# Patient Record
Sex: Female | Born: 1980 | Race: Black or African American | Hispanic: No | Marital: Married | Smoking: Never smoker
Health system: Southern US, Community
[De-identification: ages and names within clinical notes are randomized; demographics above are authoritative.]

## PROBLEM LIST (undated history)

## (undated) DIAGNOSIS — D649 Anemia, unspecified: Secondary | ICD-10-CM

## (undated) DIAGNOSIS — M6208 Separation of muscle (nontraumatic), other site: Secondary | ICD-10-CM

## (undated) DIAGNOSIS — K5909 Other constipation: Secondary | ICD-10-CM

## (undated) DIAGNOSIS — D560 Alpha thalassemia: Secondary | ICD-10-CM

## (undated) DIAGNOSIS — N200 Calculus of kidney: Secondary | ICD-10-CM

## (undated) DIAGNOSIS — N83209 Unspecified ovarian cyst, unspecified side: Secondary | ICD-10-CM

## (undated) DIAGNOSIS — Z860100 Personal history of colon polyps, unspecified: Secondary | ICD-10-CM

## (undated) DIAGNOSIS — Z8679 Personal history of other diseases of the circulatory system: Secondary | ICD-10-CM

## (undated) DIAGNOSIS — D563 Thalassemia minor: Secondary | ICD-10-CM

## (undated) HISTORY — DX: Alpha thalassemia: D56.0

## (undated) HISTORY — DX: Separation of muscle (nontraumatic), other site: M62.08

## (undated) HISTORY — DX: Personal history of colon polyps, unspecified: Z86.0100

## (undated) HISTORY — DX: Calculus of kidney: N20.0

## (undated) HISTORY — DX: Personal history of colonic polyps: Z86.010

## (undated) HISTORY — PX: HERNIA REPAIR: SHX51

## (undated) HISTORY — DX: Anemia, unspecified: D64.9

## (undated) HISTORY — PX: TUBAL LIGATION: SHX77

## (undated) HISTORY — DX: Other constipation: K59.09

## (undated) HISTORY — DX: Unspecified ovarian cyst, unspecified side: N83.209

## (undated) HISTORY — DX: Personal history of other diseases of the circulatory system: Z86.79

---
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
## 2013-04-19 HISTORY — PX: HERNIA REPAIR: SHX51

## 2014-03-19 HISTORY — PX: COLONOSCOPY: SHX174

## 2015-11-30 DIAGNOSIS — M6208 Separation of muscle (nontraumatic), other site: Secondary | ICD-10-CM | POA: Insufficient documentation

## 2015-11-30 DIAGNOSIS — K439 Ventral hernia without obstruction or gangrene: Secondary | ICD-10-CM | POA: Insufficient documentation

## 2015-11-30 HISTORY — DX: Separation of muscle (nontraumatic), other site: M62.08

## 2016-06-05 DIAGNOSIS — Z8679 Personal history of other diseases of the circulatory system: Secondary | ICD-10-CM

## 2016-06-05 DIAGNOSIS — K5909 Other constipation: Secondary | ICD-10-CM | POA: Insufficient documentation

## 2016-06-05 HISTORY — DX: Other constipation: K59.09

## 2016-06-05 HISTORY — DX: Personal history of other diseases of the circulatory system: Z86.79

## 2016-07-06 DIAGNOSIS — Z862 Personal history of diseases of the blood and blood-forming organs and certain disorders involving the immune mechanism: Secondary | ICD-10-CM | POA: Diagnosis not present

## 2016-07-06 DIAGNOSIS — Z85118 Personal history of other malignant neoplasm of bronchus and lung: Secondary | ICD-10-CM | POA: Diagnosis not present

## 2016-08-02 DIAGNOSIS — D560 Alpha thalassemia: Secondary | ICD-10-CM | POA: Diagnosis not present

## 2017-07-27 LAB — HM PAP SMEAR: HM Pap smear: NEGATIVE

## 2018-02-26 ENCOUNTER — Ambulatory Visit (INDEPENDENT_AMBULATORY_CARE_PROVIDER_SITE_OTHER): Payer: BC Managed Care – PPO | Admitting: Cardiology

## 2018-02-26 ENCOUNTER — Encounter: Payer: Self-pay | Admitting: *Deleted

## 2018-02-26 DIAGNOSIS — N200 Calculus of kidney: Secondary | ICD-10-CM | POA: Insufficient documentation

## 2018-02-26 DIAGNOSIS — R0789 Other chest pain: Secondary | ICD-10-CM | POA: Diagnosis not present

## 2018-02-26 DIAGNOSIS — D649 Anemia, unspecified: Secondary | ICD-10-CM | POA: Insufficient documentation

## 2018-02-26 NOTE — Patient Instructions (Signed)
Medication Instructions:  Your physician recommends that you continue on your current medications as directed. Please refer to the Current Medication list given to you today.  If you need a refill on your cardiac medications before your next appointment, please call your pharmacy.   Lab work: Your physician recommends that you have the following labs drawn: Please come fasting for liver and lipid panel, no appointment is needed.   If you have labs (blood work) drawn today and your tests are completely normal, you will receive your results only by: Marland Kitchen. MyChart Message (if you have MyChart) OR . A paper copy in the mail If you have any lab test that is abnormal or we need to change your treatment, we will call you to review the results.  Testing/Procedures: Your physician has requested that you have a stress echocardiogram. For further information please visit [email or web address]. Please follow instruction sheet as given.  Follow-Up: At [facility], you and your health needs are our priority.  As part of our continuing mission to provide you with exceptional heart care, we have created designated Provider Care Teams.  These Care Teams include your primary Cardiologist (physician) and Advanced Practice Providers (APPs -  Physician Assistants and Nurse Practitioners) who all work together to provide you with the care you need, when you need it.  You will need a follow up appointment in 6 months.  Please call our office 2 months in advance to schedule this appointment.  You may see another member of our [facility] Provider Team in [place]: Gypsy Balsamobert Krasowski, MD . Norman HerrlichBrian Munley, MD  Any Other Special Instructions Will Be Listed Below (If Applicable).

## 2018-02-26 NOTE — Progress Notes (Signed)
Cardiology Office Note:    Date:  02/26/2018   ID:  Judy George, DOB 1981/03/15, MRN [id number]  PCP:  Gus HeightJohnson, Andrea, PA-C  Cardiologist:  Garwin Brothersajan R Revankar, MD   Referring MD: Gus HeightJohnson, Andrea, PA-C    ASSESSMENT:    1. Chest discomfort    PLAN:    In order of problems listed above:  1. Primary prevention stressed with the patient.  Importance of compliance with diet and medication stressed and she vocalized understanding.  Her blood pressure is stable.  Her symptoms are not typical for coronary etiology.  She does not have any known significant risk factors for coronary artery disease.  To risk stratify her I will have fasting lipids drawn.  She will also undergo stress echo to understand the symptoms.  She knows to go to the nearest emergency room for any significant concerns.Patient will be seen in follow-up appointment in 6 months or earlier if the patient has any concerns.   Medication Adjustments/Labs and Tests Ordered: Current medicines are reviewed at length with the patient today.  Concerns regarding medicines are outlined above.  Orders Placed This Encounter  Procedures  . Hepatic function panel  . Lipid panel  . EKG 12-Lead  . ECHOCARDIOGRAM STRESS TEST   No orders of the defined types were placed in this encounter.    History of Present Illness:    Judy George is a 37 y.o. female who is being seen today for the evaluation of chest discomfort at the request of Gus HeightJohnson, Andrea, [place].  Patient is a pleasant 37 year old female.  She has no past medical history of essential hypertension, dyslipidemia diabetes mellitus or smoking.  Overall she is a healthy female.  She leads a sedentary lifestyle and works full-time.  She mentions to me that she has substernal chest discomfort at times and this does not occur with exertion.  She said she has had 2 aunts who passed away with coronary artery disease and therefore she is concerned about it.  When past in her  10040s and the other past in her 5060s.  Patient denies any history of syncope or dizziness.  At the time of my evaluation, the patient is alert awake oriented and in no distress.  Past Medical History:  Diagnosis Date  . Anemia   . Chronic constipation 06/05/2016  . Diastasis recti 11/30/2015  . History of irregular heartbeat 06/05/2016  . Kidney stones     Past Surgical History:  Procedure Laterality Date  . CESAREAN SECTION     X3  . HERNIA REPAIR    . TUBAL LIGATION      Current Medications: Current Meds  Medication Sig  . linaclotide (LINZESS) 290 MCG CAPS capsule Take 290 mcg by mouth at bedtime as needed.     Allergies:   Patient has no known allergies.   Social History   Socioeconomic History  . Marital status: Married    Spouse name: Not on file  . Number of children: Not on file  . Years of education: Not on file  . Highest education level: Not on file  Occupational History  . Not on file  Social Needs  . Financial resource strain: Not on file  . Food insecurity:    Worry: Not on file    Inability: Not on file  . Transportation needs:    Medical: Not on file    Non-medical: Not on file  Tobacco Use  . Smoking status: Never Smoker  . Smokeless tobacco:  Never Used  Substance and Sexual Activity  . Alcohol use: Never    Frequency: Never  . Drug use: Not on file  . Sexual activity: Not on file  Lifestyle  . Physical activity:    Days per week: Not on file    Minutes per session: Not on file  . Stress: Not on file  Relationships  . Social connections:    Talks on phone: Not on file    Gets together: Not on file    Attends religious service: Not on file    Active member of club or organization: Not on file    Attends meetings of clubs or organizations: Not on file    Relationship status: Not on file  Other Topics Concern  . Not on file  Social History Narrative  . Not on file     Family History: The patient's family history includes Diabetes in her  maternal aunt; Heart attack in her maternal aunt; Hypertension in her mother.  ROS:   Please see the history of present illness.    All other systems reviewed and are negative.  EKGs/Labs/Other Studies Reviewed:    The following studies were reviewed today: EKG reveals sinus rhythm and nonspecific ST-T changes.   Recent Labs: No results found for requested labs within last 8760 hours.  Recent Lipid Panel No results found for: CHOL, TRIG, HDL, CHOLHDL, VLDL, LDLCALC, LDLDIRECT  Physical Exam:    VS:  BP 110/74 (BP Location: Right Arm, Patient Position: Sitting, Cuff Size: Normal)   Pulse 61   Ht 5\' 2"  (1.575 m)   Wt 180 lb (81.6 kg)   BMI 32.92 kg/m     Wt Readings from Last 3 Encounters:  02/26/18 180 lb (81.6 kg)     GEN: Patient is in no acute distress HEENT: Normal NECK: No JVD; No carotid bruits LYMPHATICS: No lymphadenopathy CARDIAC: S1 S2 regular, 2/6 systolic murmur at the apex. RESPIRATORY:  Clear to auscultation without rales, wheezing or rhonchi  ABDOMEN: Soft, non-tender, non-distended MUSCULOSKELETAL:  No edema; No deformity  SKIN: Warm and dry NEUROLOGIC:  Alert and oriented x 3 PSYCHIATRIC:  Normal affect    Signed, Garwin Brothers, MD  02/26/2018 4:15 PM    [facility]

## 2018-03-05 ENCOUNTER — Other Ambulatory Visit: Payer: Self-pay

## 2018-03-13 ENCOUNTER — Telehealth: Payer: Self-pay | Admitting: *Deleted

## 2018-03-13 ENCOUNTER — Encounter: Payer: Self-pay | Admitting: *Deleted

## 2018-03-13 DIAGNOSIS — R0789 Other chest pain: Secondary | ICD-10-CM

## 2018-03-13 NOTE — Telephone Encounter (Signed)
-----   Message from Garwin Brothersajan R Revankar, MD sent at 03/10/2018  8:47 AM EST ----- Regarding: RE: Denied Stress Echo Ett and echo ----- Message ----- From: Craige CottaAnderson, Ashley S, RN Sent: 03/07/2018   4:39 PM EST To: Garwin Brothersajan R Revankar, MD, Carren RangNicholas Gipson, CMA Subject: FW: Denied Stress Echo                          ----- Message ----- From: Minette BrineWelch, Lisa B Sent: 03/06/2018   3:05 PM EST To: Craige CottaAshley S Anderson, RN Subject: FW: Denied Stress Echo                          ----- Message ----- From: Britt BologneseHall, Charmaine M Sent: 03/06/2018   2:48 PM EST To: Ludwig ClarksAdrienne C Troutman, Rajan R Revankar, MD Subject: Denied Stress Echo                             Dr. Tomie Chinaevankar  Pt's insurance denied stress echo.  Do you want to change to GXT?  (Pt can walk on treadmill and CRF strong fam hx CAD)  Thanks  Charmaine

## 2018-03-13 NOTE — Telephone Encounter (Signed)
Patient informed that insurance denied approval for a stress echocardiogram. Patient informed that Dr. Tomie Chinaevankar recommends scheduling an echocardiogram and exercise tolerance test. Patient agreeable. Patient has been scheduled for an ETT and echocardiogram on 03/20/2018 at 11 am and [facility] [address] [facility]. Reviewed instructions and sent a copy in the mail. Patient verbalized understanding. No further questions.

## 2018-03-20 ENCOUNTER — Ambulatory Visit (INDEPENDENT_AMBULATORY_CARE_PROVIDER_SITE_OTHER): Payer: BC Managed Care – PPO

## 2018-03-20 ENCOUNTER — Ambulatory Visit (HOSPITAL_COMMUNITY): Payer: BC Managed Care – PPO | Attending: Cardiovascular Disease

## 2018-03-20 DIAGNOSIS — R0789 Other chest pain: Secondary | ICD-10-CM

## 2018-03-20 LAB — EXERCISE TOLERANCE TEST
CSEPED: 7 min
Estimated workload: 9.9 METS
Exercise duration (sec): 54 s
MPHR: 181 {beats}/min
Peak HR: 179 {beats}/min
Percent HR: 98 %
RPE: 16
Rest HR: 89 {beats}/min

## 2018-03-25 ENCOUNTER — Telehealth: Payer: Self-pay

## 2018-03-25 NOTE — Telephone Encounter (Signed)
-----   Message from Garwin Brothers, MD sent at 03/25/2018  9:29 AM EST ----- The results of the study is unremarkable. Please inform patient. I will discuss in detail at next appointment. Cc  primary care/referring physician Garwin Brothers, MD 03/25/2018 9:29 AM

## 2018-03-25 NOTE — Telephone Encounter (Signed)
-----   Message from Garwin Brothers, MD sent at 03/25/2018  9:13 AM EST ----- The results of the study is unremarkable. Please inform patient. I will discuss in detail at next appointment. Cc  primary care/referring physician Garwin Brothers, MD 03/25/2018 9:13 AM

## 2018-03-25 NOTE — Telephone Encounter (Signed)
Called patient and left detailed voice message on patients phone regarding test results. 

## 2018-03-25 NOTE — Telephone Encounter (Signed)
Called patient and left detailed voice message on phone regarding test results. 

## 2018-11-07 ENCOUNTER — Other Ambulatory Visit: Payer: Self-pay | Admitting: Physician Assistant

## 2018-11-07 DIAGNOSIS — Z1231 Encounter for screening mammogram for malignant neoplasm of breast: Secondary | ICD-10-CM

## 2018-11-21 ENCOUNTER — Encounter: Payer: Self-pay | Admitting: Gastroenterology

## 2018-12-17 ENCOUNTER — Ambulatory Visit: Payer: BC Managed Care – PPO | Admitting: Gastroenterology

## 2018-12-17 ENCOUNTER — Other Ambulatory Visit: Payer: Self-pay

## 2018-12-17 ENCOUNTER — Encounter: Payer: Self-pay | Admitting: Gastroenterology

## 2018-12-17 VITALS — BP 116/82 | HR 82 | Temp 98.0°F | Ht 62.0 in | Wt 185.5 lb

## 2018-12-17 DIAGNOSIS — D509 Iron deficiency anemia, unspecified: Secondary | ICD-10-CM

## 2018-12-17 DIAGNOSIS — K581 Irritable bowel syndrome with constipation: Secondary | ICD-10-CM

## 2018-12-17 DIAGNOSIS — Z8371 Family history of colonic polyps: Secondary | ICD-10-CM | POA: Diagnosis not present

## 2018-12-17 MED ORDER — TRULANCE 3 MG PO TABS: 3.0000 mg | ORAL_TABLET | Freq: Every day | ORAL | 11 refills | Status: DC

## 2018-12-17 MED ORDER — SUPREP BOWEL PREP KIT 17.5-3.13-1.6 GM/177ML PO SOLN: 1.0000 | ORAL | 0 refills | Status: DC

## 2018-12-17 MED ORDER — OMEPRAZOLE 20 MG PO CPDR: 20.0000 mg | DELAYED_RELEASE_CAPSULE | Freq: Every day | ORAL | 11 refills | Status: DC

## 2018-12-17 NOTE — Patient Instructions (Signed)
If you are age 40 or older, your body mass index should be between 23-30. Your Body mass index is 33.93 kg/m. If this is out of the aforementioned range listed, please consider follow up with your Primary Care Provider.  If you are age 23 or younger, your body mass index should be between 19-25. Your Body mass index is 33.93 kg/m. If this is out of the aformentioned range listed, please consider follow up with your Primary Care Provider.   You have been scheduled for an endoscopy and colonoscopy. Please follow the written instructions given to you at your visit today. Please pick up your prep supplies at the pharmacy within the next 1-3 days. If you use inhalers (even only as needed), please bring them with you on the day of your procedure. Your physician has requested that you go to www.startemmi.com and enter the access code given to you at your visit today. This web site gives a general overview about your procedure. However, you should still follow specific instructions given to you by our office regarding your preparation for the procedure.  We have sent the following medications to your pharmacy for you to pick up at your convenience: Trulance Suprep  Increase water intake.   Thank you,  Dr. Jackquline Denmark

## 2018-12-17 NOTE — Progress Notes (Signed)
Chief Complaint: Constipation/lower abdominal pain.  Referring Provider:  Gus Height, PA-C      ASSESSMENT AND PLAN;   #1. IBS with constipation. Nl TSH 05/2016 #2. H/O colonic polyps 2015/2016 (Dr Judy George).  Due for repeat colonoscopy. #3. 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 GERD with NCCP.  Neg cardiac work-up. #6. FH colon polyps (mom < 50)   Plan: - Omeprazole 20mg  po qAM, 30, 3 refills. - Trulance 3mg  po qd. have given cards and coupons.  We do not have any samples.  If expensive, she will continue taking Linzess 290 as before. - Increase water intake - Proceed with EGD/colon. Discussed risks & benefits. (Risks including rare perforation req laparotomy, bleeding after biopsies/polypectomy req blood transfusion, rare chance of missing neoplasms, risks of anesthesia/sedation). Benefits outweigh the risks. Patient agrees to proceed. All the questions were answered. Consent forms given for review. -If still with Abdo pain, consider CT Abdo/pelvis possibly followed by anorectal manometry/Sitzmarks study.    HPI:    Judy George is a 38 y.o. female  Judy George, mom is a patient of ours Longstanding history of the patient ever since first pregnancy with daughter 15 years ago.  She does pass pellet-like stools, associated lower abdominal cramps which gets better with defecation, abdominal bloating.  If she does not take any laxatives, she would have 1 bowel movement in 1 to 2 weeks.  She does try to drink plenty of water.  Has tried multiple medications including MiraLAX which did not work very well.  Linzess 290 works off and on.  Has tried herbal tea as well.  Also diagnosed with iron deficiency anemia.  Rare rectal bleeding especially if she gets severely constipated.  Does complain of heartburn.  No significant odynophagia or dysphagia.  Had noncardiac chest pains requiring 2D echo and cardiac stress test in January which was negative.  Advised GI work-up.  Denies  nonsteroidal use.  Denies intake of calcium  No weight loss or loss of appetite.   Had colonoscopy performed by Dr. Chartered loss George ?  2015/2016-had one colonic polyp removed.  Mom also had polyps. Past Medical History:  Diagnosis Date  . Anemia   . Chronic constipation 06/05/2016  . Diastasis recti 11/30/2015  . History of colon polyps   . History of irregular heartbeat 06/05/2016  . Kidney stones     Past Surgical History:  Procedure Laterality Date  . CESAREAN SECTION     X3  . COLONOSCOPY  2016   Dr 06/07/2016 in [place] 1 removed and it was non cancerous   . HERNIA REPAIR  04/2013  . TUBAL LIGATION      Family History  Problem Relation Age of Onset  . Hypertension Mother   . Colon polyps Mother   . Diabetes Maternal Aunt   . Heart attack Maternal Aunt   . Colon cancer Neg Hx   . Esophageal cancer Neg Hx     Social History   Tobacco Use  . Smoking status: Never Smoker  . Smokeless tobacco: Never Used  Substance Use Topics  . Alcohol use: Never    Frequency: Never  . Drug use: Never    Current Outpatient Medications  Medication Sig Dispense Refill  . Docusate Sodium (COLACE PO) Take 3 tablets by mouth as needed.    . linaclotide (LINZESS) 290 MCG CAPS capsule Take 290 mcg by mouth daily.     . polyethylene glycol (MIRALAX / GLYCOLAX) 17 g packet Take 17  g by mouth as needed.     No current facility-administered medications for this visit.     No Known Allergies  Review of Systems:  Constitutional: Denies fever, chills, diaphoresis, appetite change and fatigue.  HEENT: Denies photophobia, eye pain, redness, hearing loss, ear pain, congestion, sore throat, rhinorrhea, sneezing, mouth sores, neck pain, neck stiffness and tinnitus.   Respiratory: Denies SOB, DOE, cough, chest tightness,  and wheezing.   Cardiovascular: Denies chest pain, palpitations and leg swelling.  Genitourinary: Denies dysuria, urgency, frequency, hematuria, flank pain and difficulty urinating.   Musculoskeletal: Denies myalgias, back pain, joint swelling, arthralgias and gait problem.  Skin: No rash.  Neurological: Denies dizziness, seizures, syncope, weakness, light-headedness, numbness and headaches.  Hematological: Denies adenopathy. Easy bruising, personal or family bleeding history  Psychiatric/Behavioral: No anxiety or depression     Physical Exam:    BP 116/82   Pulse 82   Temp 98 F (36.7 C)   Ht 5\' 2"  (1.575 m)   Wt 185 lb 8 oz (84.1 kg)   BMI 33.93 kg/m  Filed Weights   12/17/18 0915  Weight: 185 lb 8 oz (84.1 kg)   Constitutional:  Well-developed, in no acute distress. Psychiatric: Normal mood and affect. Behavior is normal. HEENT: Pupils normal.  Conjunctivae are normal. No scleral icterus. Neck supple.  Cardiovascular: Normal rate, regular rhythm. No edema Pulmonary/chest: Effort normal and breath sounds normal. No wheezing, rales or rhonchi. Abdominal: Soft, nondistended. Nontender. Bowel sounds active throughout. There are no masses palpable. No hepatomegaly.  Rectal diastases. Rectal:  defered Neurological: Alert and oriented to person place and time. Skin: Skin is warm and dry. No rashes noted.  Data Reviewed: I have personally reviewed following labs and imaging studies Labs reviewed from 05/2017 hemoglobin 11.8, MCV 72.  Labs 05/2016 hemoglobin 11.2, MCV 70.  Normal CMP otherwise.  Normal TSH 05/2016   Judy Austria, MD 12/17/2018, 9:23 AM  Cc: Judy Jump, PA-C

## 2018-12-22 ENCOUNTER — Other Ambulatory Visit: Payer: Self-pay

## 2018-12-22 ENCOUNTER — Ambulatory Visit
Admission: RE | Admit: 2018-12-22 | Discharge: 2018-12-22 | Disposition: A | Discharge status: Home / Self Care | Payer: BC Managed Care – PPO | Source: Ambulatory Visit | Attending: Medical | Admitting: Medical

## 2018-12-22 DIAGNOSIS — Z1231 Encounter for screening mammogram for malignant neoplasm of breast: Secondary | ICD-10-CM

## 2018-12-23 LAB — HM MAMMOGRAPHY

## 2019-01-09 ENCOUNTER — Encounter: Payer: Self-pay | Admitting: Gastroenterology

## 2019-01-23 ENCOUNTER — Other Ambulatory Visit: Payer: Self-pay

## 2019-01-23 ENCOUNTER — Encounter: Payer: Self-pay | Admitting: Gastroenterology

## 2019-01-23 ENCOUNTER — Ambulatory Visit (AMBULATORY_SURGERY_CENTER): Payer: BC Managed Care – PPO | Admitting: Gastroenterology

## 2019-01-23 VITALS — BP 124/70 | HR 72 | Temp 98.3°F | Resp 15 | Ht 62.0 in | Wt 185.0 lb

## 2019-01-23 DIAGNOSIS — D508 Other iron deficiency anemias: Secondary | ICD-10-CM

## 2019-01-23 DIAGNOSIS — K2950 Unspecified chronic gastritis without bleeding: Secondary | ICD-10-CM | POA: Diagnosis present

## 2019-01-23 DIAGNOSIS — K581 Irritable bowel syndrome with constipation: Secondary | ICD-10-CM

## 2019-01-23 DIAGNOSIS — K648 Other hemorrhoids: Secondary | ICD-10-CM | POA: Diagnosis not present

## 2019-01-23 LAB — HM COLONOSCOPY

## 2019-01-23 MED ORDER — SODIUM CHLORIDE 0.9 % IV SOLN: 500.0000 mL | Freq: Once | INTRAVENOUS | Status: DC

## 2019-01-23 NOTE — Progress Notes (Signed)
Called to room to assist during endoscopic procedure.  Patient ID and intended procedure confirmed with present staff. Received instructions for my participation in the procedure from the performing physician.  

## 2019-01-23 NOTE — Progress Notes (Signed)
Report to PACU, RN, vss, BBS= Clear.  

## 2019-01-23 NOTE — Patient Instructions (Signed)
YOU HAD AN ENDOSCOPIC PROCEDURE TODAY AT THE [facility]:   Refer to the procedure report that was given to you for any specific questions about what was found during the examination.  If the procedure report does not answer your questions, please call your gastroenterologist to clarify.  If you requested that your care partner not be given the details of your procedure findings, then the procedure report has been included in a sealed envelope for you to review at your convenience later.  YOU SHOULD EXPECT: Some feelings of bloating in the abdomen. Passage of more gas than usual.  Walking can help get rid of the air that was put into your GI tract during the procedure and reduce the bloating. If you had a lower endoscopy (such as a colonoscopy or flexible sigmoidoscopy) you may notice spotting of blood in your stool or on the toilet paper. If you underwent a bowel prep for your procedure, you may not have a normal bowel movement for a few days.  Please Note:  You might notice some irritation and congestion in your nose or some drainage.  This is from the oxygen used during your procedure.  There is no need for concern and it should clear up in a day or so.  SYMPTOMS TO REPORT IMMEDIATELY:   Following lower endoscopy (colonoscopy or flexible sigmoidoscopy):  Excessive amounts of blood in the stool  Significant tenderness or worsening of abdominal pains  Swelling of the abdomen that is new, acute  Fever of 100F or higher   Following upper endoscopy (EGD)  Vomiting of blood or coffee ground material  New chest pain or pain under the shoulder blades  Painful or persistently difficult swallowing  New shortness of breath  Fever of 100F or higher  Black, tarry-looking stools  For urgent or emergent issues, a gastroenterologist can be reached at any hour by calling [phone].   DIET:  We do recommend a small meal at first, but then you may proceed to your regular diet.  Drink  plenty of fluids but you should avoid alcoholic beverages for 24 hours.  ACTIVITY:  You should plan to take it easy for the rest of today and you should NOT DRIVE or use heavy machinery until tomorrow (because of the sedation medicines used during the test).    FOLLOW UP: Our staff will call the number listed on your records 48-72 hours following your procedure to check on you and address any questions or concerns that you may have regarding the information given to you following your procedure. If we do not reach you, we will leave a message.  We will attempt to reach you two times.  During this call, we will ask if you have developed any symptoms of COVID 19. If you develop any symptoms (ie: fever, flu-like symptoms, shortness of breath, cough etc.) before then, please call [phone].  If you test positive for Covid 19 in the 2 weeks post procedure, please call and report this information to us.    If any biopsies were taken you will be contacted by phone or by letter within the next 1-3 weeks.  Please call us at [phone] if you have not heard about the biopsies in 3 weeks.    SIGNATURES/CONFIDENTIALITY: You and/or your care partner have signed paperwork which will be entered into your electronic medical record.  These signatures attest to the fact that that the information above on your After Visit Summary has been reviewed and is   understood.  Full responsibility of the confidentiality of this discharge information lies with you and/or your care-partner. 

## 2019-01-23 NOTE — Op Note (Signed)
[facility] Patient Name: Judy George Procedure Date: 01/23/2019 10:20 AM MRN: [id number] Endoscopist: Jackquline Denmark , MD Age: 38 Referring MD:  Date of Birth: [birth year] Gender: Female Account #: [id number] Procedure:                Upper GI endoscopy Indications:              Iron deficiency anemia, Heartburn Medicines:                Monitored Anesthesia Care Procedure:                Pre-Anesthesia Assessment:                           - Prior to the procedure, a History and Physical                            was performed, and patient medications and                            allergies were reviewed. The patient's tolerance of                            previous anesthesia was also reviewed. The risks                            and benefits of the procedure and the sedation                            options and risks were discussed with the patient.                            All questions were answered, and informed consent                            was obtained. Prior Anticoagulants: The patient has                            taken no previous anticoagulant or antiplatelet                            agents. ASA Grade Assessment: II - A patient with                            mild systemic disease. After reviewing the risks                            and benefits, the patient was deemed in                            satisfactory condition to undergo the procedure.                           After obtaining informed consent, the endoscope was  passed under direct vision. Throughout the                            procedure, the patient's blood pressure, pulse, and                            oxygen saturations were monitored continuously. The                            Endoscope was introduced through the mouth, and                            advanced to the second part of duodenum. The upper                            GI endoscopy was  accomplished without difficulty.                            The patient tolerated the procedure well. Scope In: Scope Out: Findings:                 The examined esophagus was normal.                           The Z-line was regular and was found 35 cm from the                            incisors.                           Localized minimal inflammation characterized by                            erythema was found in the gastric antrum. Biopsies                            were taken with a cold forceps for histology.                           The examined duodenum was normal. Biopsies for                            histology were taken with a cold forceps for                            evaluation of celiac disease. Complications:            No immediate complications. Estimated Blood Loss:     Estimated blood loss: none. Impression:               -Minimal gastritis                           -Otherwise normal EGD. Recommendation:           - Patient has a contact number available for  emergencies. The signs and symptoms of potential                            delayed complications were discussed with the                            patient. Return to normal activities tomorrow.                            Written discharge instructions were provided to the                            patient.                           - Resume previous diet.                           - Continue present medications.                           - Await pathology results.                           - No ibuprofen, naproxen, or other non-steroidal                            anti-inflammatory drugs.                           - Return to GI clinic in 12 weeks. Judy Bolognaajesh [name], MD 01/23/2019 10:58:02 AM This report has been signed electronically.

## 2019-01-23 NOTE — Op Note (Signed)
[facility] Patient Name: Judy George Procedure Date: 01/23/2019 10:20 AM MRN: [id number] Endoscopist: Lynann Bologna , MD Age: 38 Referring MD:  Date of Birth: Mar 12, 1981 Gender: Female Account #: [id number] Procedure:                Colonoscopy Indications:              Colon cancer screening in patient at increased                            risk: Family history of 1st-degree relative with                            colon polyps before age 59 years. Iron deficiency                            anemia. ? History of polyps. Medicines:                Monitored Anesthesia Care Procedure:                Pre-Anesthesia Assessment:                           - Prior to the procedure, a History and Physical                            was performed, and patient medications and                            allergies were reviewed. The patient's tolerance of                            previous anesthesia was also reviewed. The risks                            and benefits of the procedure and the sedation                            options and risks were discussed with the patient.                            All questions were answered, and informed consent                            was obtained. Prior Anticoagulants: The patient has                            taken no previous anticoagulant or antiplatelet                            agents. ASA Grade Assessment: II - A patient with                            mild systemic disease. After reviewing the risks  and benefits, the patient was deemed in                            satisfactory condition to undergo the procedure.                           After obtaining informed consent, the colonoscope                            was passed under direct vision. Throughout the                            procedure, the patient's blood pressure, pulse, and                            oxygen saturations were monitored  continuously. The                            Colonoscope was introduced through the anus and                            advanced to the 2 cm into the ileum. The                            colonoscopy was performed without difficulty. The                            patient tolerated the procedure well. The quality                            of the bowel preparation was good. The terminal                            ileum, ileocecal valve, appendiceal orifice, and                            rectum were photographed. Scope In: 10:39:44 AM Scope Out: 10:53:02 AM Scope Withdrawal Time: 0 hours 9 minutes 0 seconds  Total Procedure Duration: 0 hours 13 minutes 18 seconds  Findings:                 The colon (entire examined portion) appeared                            normal. The colon was highly redundant.                           Non-bleeding internal hemorrhoids were found during                            retroflexion. The hemorrhoids were small.                           The terminal ileum appeared normal.  The exam was otherwise without abnormality on                            direct and retroflexion views. Complications:            No immediate complications. Estimated Blood Loss:     Estimated blood loss: none. Impression:               -Small internal hemorrhoids.                           -Otherwise normal colonoscopy to TI. The colon was                            highly redundant. Recommendation:           - Patient has a contact number available for                            emergencies. The signs and symptoms of potential                            delayed complications were discussed with the                            patient. Return to normal activities tomorrow.                            Written discharge instructions were provided to the                            patient.                           - Resume previous diet.                            - Continue present medications.                           - Repeat colonoscopy in 10 years for screening                            purposes. Earlier, if with any new problems or if                            there is any change in family history.                           - Return to GI clinic in 8 weeks. Jackquline Denmark, MD 01/23/2019 11:03:02 AM This report has been signed electronically.

## 2019-01-27 ENCOUNTER — Telehealth: Payer: Self-pay

## 2019-01-27 ENCOUNTER — Telehealth: Payer: Self-pay | Admitting: *Deleted

## 2019-01-27 NOTE — Telephone Encounter (Signed)
First follow up call attempt.  Left message on voicemail to call with any questions or concerns.

## 2019-01-27 NOTE — Telephone Encounter (Signed)
2nd follow up call made.  NALM 

## 2019-01-30 ENCOUNTER — Telehealth: Payer: Self-pay | Admitting: Gastroenterology

## 2019-01-30 NOTE — Telephone Encounter (Signed)
Please review and advise.

## 2019-01-30 NOTE — Telephone Encounter (Signed)
The colonoscopy was negative (see report) EGD biopsies-negative for celiac disease, negative for H. Pylori. She should be getting a letter fairly soon.  Thx  RG

## 2019-01-30 NOTE — Telephone Encounter (Signed)
Left message for patient to call back to the office;  

## 2019-01-30 NOTE — Telephone Encounter (Signed)
Pt inquired about results of colonoscopy.  

## 2019-01-30 NOTE — Telephone Encounter (Signed)
Pt returned your call.  

## 2019-01-30 NOTE — Telephone Encounter (Signed)
Called and spoke with patient-patient informed of result note and MD recommendations; patient is agreeable with plan of care; Patient verbalized understanding of information/instructions;  Patient was advised to call the office at [phone] if questions/concerns arise; 

## 2019-02-01 ENCOUNTER — Encounter: Payer: Self-pay | Admitting: Gastroenterology

## 2019-03-24 ENCOUNTER — Ambulatory Visit: Payer: BC Managed Care – PPO | Admitting: Cardiology

## 2019-03-24 ENCOUNTER — Telehealth: Payer: Self-pay

## 2019-03-24 NOTE — Telephone Encounter (Signed)
Left message for patient to call office to see if she was able to be seen earlier today.

## 2019-06-04 ENCOUNTER — Encounter: Payer: Self-pay | Admitting: Nurse Practitioner

## 2019-06-04 ENCOUNTER — Other Ambulatory Visit: Payer: Self-pay | Admitting: Family Medicine

## 2019-06-04 ENCOUNTER — Other Ambulatory Visit: Payer: Self-pay

## 2019-06-04 ENCOUNTER — Ambulatory Visit: Payer: BC Managed Care – PPO | Admitting: Nurse Practitioner

## 2019-06-04 VITALS — BP 108/72 | HR 96 | Temp 97.9°F | Ht 62.0 in | Wt 163.0 lb

## 2019-06-04 DIAGNOSIS — E6609 Other obesity due to excess calories: Secondary | ICD-10-CM | POA: Diagnosis not present

## 2019-06-04 DIAGNOSIS — K5909 Other constipation: Secondary | ICD-10-CM | POA: Diagnosis not present

## 2019-06-04 DIAGNOSIS — Z6831 Body mass index (BMI) 31.0-31.9, adult: Secondary | ICD-10-CM | POA: Insufficient documentation

## 2019-06-04 DIAGNOSIS — Z6830 Body mass index (BMI) 30.0-30.9, adult: Secondary | ICD-10-CM | POA: Diagnosis not present

## 2019-06-04 MED ORDER — PHENTERMINE HCL 37.5 MG PO TABS: 37.5000 mg | ORAL_TABLET | Freq: Every day | ORAL | 0 refills | Status: DC

## 2019-06-04 NOTE — Patient Instructions (Addendum)
Class 1 obesity due to excess calories without serious comorbidity with body mass index (BMI) of 30.0 to 30.9 in adult Well controlled.  No changes to medication dose, refill sent to pharmacy Continue to work on eating a healthy diet and exercise.  No Labs drawn today.   Chronic constipation Well controlled.  No changes to medication dose. Continue to work on eating a healthy diet and exercise.  No Labs drawn today.    Calorie Counting for Weight Loss Calories are units of energy. Your body needs a certain amount of calories from food to keep you going throughout the day. When you eat more calories than your body needs, your body stores the extra calories as fat. When you eat fewer calories than your body needs, your body burns fat to get the energy it needs. Calorie counting means keeping track of how many calories you eat and drink each day. Calorie counting can be helpful if you need to lose weight. If you make sure to eat fewer calories than your body needs, you should lose weight. Ask your health care provider what a healthy weight is for you. For calorie counting to work, you will need to eat the right number of calories in a day in order to lose a healthy amount of weight per week. A dietitian can help you determine how many calories you need in a day and will give you suggestions on how to reach your calorie goal.  A healthy amount of weight to lose per week is usually 1-2 lb (0.5-0.9 kg). This usually means that your daily calorie intake should be reduced by 500-750 calories.  Eating 1,200 - 1,500 calories per day can help most women lose weight.  Eating 1,500 - 1,800 calories per day can help most men lose weight. What is my plan? My goal is to have __________ calories per day. If I have this many calories per day, I should lose around __________ pounds per week. What do I need to know about calorie counting? In order to meet your daily calorie goal, you will need to:  Find  out how many calories are in each food you would like to eat. Try to do this before you eat.  Decide how much of the food you plan to eat.  Write down what you ate and how many calories it had. Doing this is called keeping a food log. To successfully lose weight, it is important to balance calorie counting with a healthy lifestyle that includes regular activity. Aim for 150 minutes of moderate exercise (such as walking) or 75 minutes of vigorous exercise (such as running) each week. Where do I find calorie information?  The number of calories in a food can be found on a Nutrition Facts label. If a food does not have a Nutrition Facts label, try to look up the calories online or ask your dietitian for help. Remember that calories are listed per serving. If you choose to have more than one serving of a food, you will have to multiply the calories per serving by the amount of servings you plan to eat. For example, the label on a package of bread might say that a serving size is 1 slice and that there are 90 calories in a serving. If you eat 1 slice, you will have eaten 90 calories. If you eat 2 slices, you will have eaten 180 calories. How do I keep a food log? Immediately after each meal, record the following information in your  food log:  What you ate. Don't forget to include toppings, sauces, and other extras on the food.  How much you ate. This can be measured in cups, ounces, or number of items.  How many calories each food and drink had.  The total number of calories in the meal. Keep your food log near you, such as in a small notebook in your pocket, or use a mobile app or website. Some programs will calculate calories for you and show you how many calories you have left for the day to meet your goal. What are some calorie counting tips?   Use your calories on foods and drinks that will fill you up and not leave you hungry: ? Some examples of foods that fill you up are nuts and nut  butters, vegetables, lean proteins, and high-fiber foods like whole grains. High-fiber foods are foods with more than 5 g fiber per serving. ? Drinks such as sodas, specialty coffee drinks, alcohol, and juices have a lot of calories, yet do not fill you up.  Eat nutritious foods and avoid empty calories. Empty calories are calories you get from foods or beverages that do not have many vitamins or protein, such as candy, sweets, and soda. It is better to have a nutritious high-calorie food (such as an avocado) than a food with few nutrients (such as a bag of chips).  Know how many calories are in the foods you eat most often. This will help you calculate calorie counts faster.  Pay attention to calories in drinks. Low-calorie drinks include water and unsweetened drinks.  Pay attention to nutrition labels for "low fat" or "fat free" foods. These foods sometimes have the same amount of calories or more calories than the full fat versions. They also often have added sugar, starch, or salt, to make up for flavor that was removed with the fat.  Find a way of tracking calories that works for you. Get creative. Try different apps or programs if writing down calories does not work for you. What are some portion control tips?  Know how many calories are in a serving. This will help you know how many servings of a certain food you can have.  Use a measuring cup to measure serving sizes. You could also try weighing out portions on a kitchen scale. With time, you will be able to estimate serving sizes for some foods.  Take some time to put servings of different foods on your favorite plates, bowls, and cups so you know what a serving looks like.  Try not to eat straight from a bag or box. Doing this can lead to overeating. Put the amount you would like to eat in a cup or on a plate to make sure you are eating the right portion.  Use smaller plates, glasses, and bowls to prevent overeating.  Try not to  multitask (for example, watch TV or use your computer) while eating. If it is time to eat, sit down at a table and enjoy your food. This will help you to know when you are full. It will also help you to be aware of what you are eating and how much you are eating. What are tips for following this plan? Reading food labels  Check the calorie count compared to the serving size. The serving size may be smaller than what you are used to eating.  Check the source of the calories. Make sure the food you are eating is high in vitamins and  protein and low in saturated and trans fats. Shopping  Read nutrition labels while you shop. This will help you make healthy decisions before you decide to purchase your food.  Make a grocery list and stick to it. Cooking  Try to cook your favorite foods in a healthier way. For example, try baking instead of frying.  Use low-fat dairy products. Meal planning  Use more fruits and vegetables. Half of your plate should be fruits and vegetables.  Include lean proteins like poultry and fish. How do I count calories when eating out?  Ask for smaller portion sizes.  Consider sharing an entree and sides instead of getting your own entree.  If you get your own entree, eat only half. Ask for a box at the beginning of your meal and put the rest of your entree in it so you are not tempted to eat it.  If calories are listed on the menu, choose the lower calorie options.  Choose dishes that include vegetables, fruits, whole grains, low-fat dairy products, and lean protein.  Choose items that are boiled, broiled, grilled, or steamed. Stay away from items that are buttered, battered, fried, or served with cream sauce. Items labeled "crispy" are usually fried, unless stated otherwise.  Choose water, low-fat milk, unsweetened iced tea, or other drinks without added sugar. If you want an alcoholic beverage, choose a lower calorie option such as a glass of wine or light  beer.  Ask for dressings, sauces, and syrups on the side. These are usually high in calories, so you should limit the amount you eat.  If you want a salad, choose a garden salad and ask for grilled meats. Avoid extra toppings like bacon, cheese, or fried items. Ask for the dressing on the side, or ask for olive oil and vinegar or lemon to use as dressing.  Estimate how many servings of a food you are given. For example, a serving of cooked rice is  cup or about the size of half a baseball. Knowing serving sizes will help you be aware of how much food you are eating at restaurants. The list below tells you how big or small some common portion sizes are based on everyday objects: ? 1 oz--4 stacked dice. ? 3 oz--1 deck of cards. ? 1 tsp--1 die. ? 1 Tbsp-- a ping-pong ball. ? 2 Tbsp--1 ping-pong ball. ?  cup-- baseball. ? 1 cup--1 baseball. Summary  Calorie counting means keeping track of how many calories you eat and drink each day. If you eat fewer calories than your body needs, you should lose weight.  A healthy amount of weight to lose per week is usually 1-2 lb (0.5-0.9 kg). This usually means reducing your daily calorie intake by 500-750 calories.  The number of calories in a food can be found on a Nutrition Facts label. If a food does not have a Nutrition Facts label, try to look up the calories online or ask your dietitian for help.  Use your calories on foods and drinks that will fill you up, and not on foods and drinks that will leave you hungry.  Use smaller plates, glasses, and bowls to prevent overeating. This information is not intended to replace advice given to you by your health care provider. Make sure you discuss any questions you have with your health care provider. Document Revised: 11/22/2017 Document Reviewed: 02/03/2016 Elsevier Patient Education  2020 [employer] BMI for Adults What is BMI? Body mass index (BMI) is a number that  is calculated from a person's  weight and height. BMI can help estimate how much of a person's weight is composed of fat. BMI does not measure body fat directly. Rather, it is an alternative to procedures that directly measure body fat, which can be difficult and expensive. BMI can help identify people who may be at higher risk for certain medical problems. What are BMI measurements used for? BMI is used as a screening tool to identify possible weight problems. It helps determine whether a person is obese, overweight, a healthy weight, or underweight. BMI is useful for:  Identifying a weight problem that may be related to a medical condition or may increase the risk for medical problems.  Promoting changes, such as changes in diet and exercise, to help reach a healthy weight. BMI screening can be repeated to see if these changes are working. How is BMI calculated? BMI involves measuring your weight in relation to your height. Both height and weight are measured, and the BMI is calculated from those numbers. This can be done either in [place] or metric measurements. Note that charts and online BMI calculators are available to help you find your BMI quickly and easily without having to do these calculations yourself. To calculate your BMI in [place] ([place] measurements:  1. Measure your weight in pounds (lb). 2. Multiply the number of pounds by 703. ? For example, for a person who weighs 180 lb, multiply that number by 703, which equals 126,540. 3. Measure your height in inches. Then multiply that number by itself to get a measurement called "inches squared." ? For example, for a person who is 70 inches tall, the "inches squared" measurement is 70 inches x 70 inches, which equals 4,900 inches squared. 4. Divide the total from step 2 (number of lb x 703) by the total from step 3 (inches squared): 126,540  4,900 = 25.8. This is your BMI. To calculate your BMI in metric measurements: 1. Measure your weight in kilograms  (kg). 2. Measure your height in meters (m). Then multiply that number by itself to get a measurement called "meters squared." ? For example, for a person who is 1.75 m tall, the "meters squared" measurement is 1.75 m x 1.75 m, which is equal to 3.1 meters squared. 3. Divide the number of kilograms (your weight) by the meters squared number. In this example: 70  3.1 = 22.6. This is your BMI. What do the results mean? BMI charts are used to identify whether you are underweight, normal weight, overweight, or obese. The following guidelines will be used:  Underweight: BMI less than 18.5.  Normal weight: BMI between 18.5 and 24.9.  Overweight: BMI between 25 and 29.9.  Obese: BMI of 30 or above. Keep these notes in mind:  Weight includes both fat and muscle, so someone with a muscular build, such as an athlete, may have a BMI that is higher than 24.9. In cases like these, BMI is not an accurate measure of body fat.  To determine if excess body fat is the cause of a BMI of 25 or higher, further assessments may need to be done by a health care provider.  BMI is usually interpreted in the same way for men and women. Where to find more information For more information about BMI, including tools to quickly calculate your BMI, go to these websites:  Centers for Disease Control and Prevention: FootballExhibition.com.brwww.cdc.gov  American Heart Association: www.heart.org  National Heart, Lung, and Blood Institute: PopSteam.iswww.nhlbi.nih.gov Summary  Body mass index (BMI) is a number that is calculated from a person's weight and height.  BMI may help estimate how much of a person's weight is composed of fat. BMI can help identify those who may be at higher risk for certain medical problems.  BMI can be measured using English measurements or metric measurements.  BMI charts are used to identify whether you are underweight, normal weight, overweight, or obese. This information is not intended to replace advice given to you  by your health care provider. Make sure you discuss any questions you have with your health care provider. Document Revised: 11/26/2018 Document Reviewed: 10/03/2018 Elsevier Patient Education  2020 [employer].

## 2019-06-04 NOTE — Assessment & Plan Note (Signed)
Well controlled.  No changes to medication dose. Continue to work on eating a healthy diet and exercise.  No Labs drawn today.

## 2019-06-04 NOTE — Progress Notes (Signed)
Established Patient Office Visit  Subjective:  Patient ID: Judy George, female    DOB: [birth year]  Age: 39 y.o. MRN: [id number]  CC: Patient  is a  39 year old female. She is here for 2 months follow up weight loss medication and chronic constipation. The patient's medications were reviewed and reconciled since the patient's last visit.  History details were provided by the patient. The history appears to be reliable.   Chief Complaint  Patient presents with  . Follow-up    2 months follow up on weight    HPI [place] presents for moderate obesity due to excess calories  she follow a prescribed diet. Compliance with treatment has been good; takes medication as directed, maintains diet and exercise. Current weight loss medications include  Adipex-P 37.5 mgThe patient is having no side effects from medicines. The patient has lost 9.8 lbs since last visit and 18.1 lbs since starting weight loss medicine.   Past Medical History:  Diagnosis Date  . Alpha+ thalassemia   . Anemia   . Chronic constipation 06/05/2016  . Diastasis recti 11/30/2015  . History of colon polyps   . History of irregular heartbeat 06/05/2016  . Kidney stones     Past Surgical History:  Procedure Laterality Date  . CESAREAN SECTION     X3  . COLONOSCOPY  2016   Dr Jerilynn Mages in [place] 1 removed and it was non cancerous   . HERNIA REPAIR  04/2013  . TUBAL LIGATION      Family History  Problem Relation Age of Onset  . Hypertension Mother   . Colon polyps Mother   . Diabetes Maternal Aunt   . Heart attack Maternal Aunt   . Colon cancer Neg Hx   . Esophageal cancer Neg Hx     Social History   Socioeconomic History  . Marital status: Married    Spouse name: Not on file  . Number of children: 2  . Years of education: Not on file  . Highest education level: Not on file  Occupational History  . Occupation: Paramedic  Tobacco Use  . Smoking status: Never Smoker  . Smokeless  tobacco: Never Used  Substance and Sexual Activity  . Alcohol use: Never  . Drug use: Never  . Sexual activity: Yes  Other Topics Concern  . Not on file  Social History Narrative  . Not on file   Social Determinants of Health   Financial Resource Strain:   . Difficulty of Paying Living Expenses:   Food Insecurity:   . Worried About Charity fundraiser in the Last Year:   . Arboriculturist in the Last Year:   Transportation Needs:   . Film/video editor (Medical):   Marland Kitchen Lack of Transportation (Non-Medical):   Physical Activity:   . Days of Exercise per Week:   . Minutes of Exercise per Session:   Stress:   . Feeling of Stress :   Social Connections:   . Frequency of Communication with Friends and Family:   . Frequency of Social Gatherings with Friends and Family:   . Attends Religious Services:   . Active Member of Clubs or Organizations:   . Attends Archivist Meetings:   Marland Kitchen Marital Status:   Intimate Partner Violence:   . Fear of Current or Ex-Partner:   . Emotionally Abused:   Marland Kitchen Physically Abused:   . Sexually Abused:     Outpatient Medications Prior  to Visit  Medication Sig Dispense Refill  . linaclotide (LINZESS) 290 MCG CAPS capsule Take 290 mcg by mouth daily.     Marland Kitchen omeprazole (PRILOSEC) 20 MG capsule Take 1 capsule (20 mg total) by mouth daily. 30 capsule 11  . phentermine (ADIPEX-P) 37.5 MG tablet Take 37.5 mg by mouth daily.    Mariane Baumgarten Sodium (COLACE PO) Take 3 tablets by mouth as needed.    Marland Kitchen Plecanatide (TRULANCE) 3 MG TABS Take 3 mg by mouth daily. 30 tablet 11  . polyethylene glycol (MIRALAX / GLYCOLAX) 17 g packet Take 17 g by mouth as needed.     No facility-administered medications prior to visit.    No Known Allergies  ROS Review of Systems  Constitutional: Negative for activity change and appetite change.  HENT: Negative for congestion, ear pain and sinus pain.   Eyes: Negative for pain.  Respiratory: Negative for chest  tightness and shortness of breath.   Cardiovascular: Negative for chest pain and palpitations.  Gastrointestinal: Positive for constipation. Negative for abdominal distention and abdominal pain.  Genitourinary: Negative for difficulty urinating.  Musculoskeletal: Negative for arthralgias and myalgias.  Skin: Negative for rash.  Neurological: Negative for headaches.  Psychiatric/Behavioral: Negative for behavioral problems.      Objective:    Physical Exam  Constitutional: She is oriented to person, place, and time. She appears well-developed and well-nourished.  HENT:  Head: Normocephalic.  Right Ear: External ear normal.  Mouth/Throat: Oropharynx is clear and moist.  Eyes: Conjunctivae are normal.  Cardiovascular: Normal rate, regular rhythm and normal heart sounds.  Pulmonary/Chest: Effort normal and breath sounds normal.  Abdominal: Soft. Bowel sounds are normal.  Musculoskeletal:        General: No tenderness.     Cervical back: Neck supple.  Neurological: She is alert and oriented to person, place, and time.  Skin: Skin is warm. No rash noted.  Psychiatric: She has a normal mood and affect.    BP 108/72 (BP Location: Left Arm, Patient Position: Sitting)   Pulse 96   Temp 97.9 F (36.6 C) (Temporal)   Ht '5\' 2"'  (1.575 m)   Wt 163 lb (73.9 kg)   LMP 01/20/2019 (Exact Date)   SpO2 100%   BMI 29.81 kg/m  Wt Readings from Last 3 Encounters:  06/04/19 163 lb (73.9 kg)  01/23/19 185 lb (83.9 kg)  12/17/18 185 lb 8 oz (84.1 kg)     Health Maintenance Due  Topic Date Due  . HIV Screening  Never done  . TETANUS/TDAP  Never done  . PAP SMEAR-Modifier  Never done  . INFLUENZA VACCINE  Never done    There are no preventive care reminders to display for this patient.  No results found for: TSH No results found for: WBC, HGB, HCT, MCV, PLT No results found for: NA, K, CHLORIDE, CO2, GLUCOSE, BUN, CREATININE, BILITOT, ALKPHOS, AST, ALT, PROT, ALBUMIN, CALCIUM,  ANIONGAP, EGFR, GFR No results found for: CHOL No results found for: HDL No results found for: LDLCALC No results found for: TRIG No results found for: CHOLHDL No results found for: HGBA1C    Assessment & Plan:  Class 1 obesity due to excess calories without serious comorbidity with body mass index (BMI) of 30.0 to 30.9 in adult Well controlled.  No changes to medication dose, refill sent to pharmacy Continue to work on eating a healthy diet and exercise.  No Labs drawn today.   Chronic constipation Well controlled.  No changes to  medication dose. Continue to work on eating a healthy diet and exercise.  No Labs drawn today.   Problem List Items Addressed This Visit       Digestive   Chronic constipation    Well controlled.  No changes to medication dose. Continue to work on eating a healthy diet and exercise.  No Labs drawn today.         Other   Class 1 obesity due to excess calories without serious comorbidity with body mass index (BMI) of 30.0 to 30.9 in adult - Primary    Well controlled.  No changes to medication dose, refill sent to pharmacy Continue to work on eating a healthy diet and exercise.  No Labs drawn today.       Relevant Medications   phentermine (ADIPEX-P) 37.5 MG tablet       Meds ordered this encounter  Medications  . phentermine (ADIPEX-P) 37.5 MG tablet    Sig: Take 1 tablet (37.5 mg total) by mouth daily.    Dispense:  30 tablet    Refill:  0    Order Specific Question:   Supervising Provider    Answer:   Bo Merino [[id number]]    Follow-up: Return in about 1 month (around 07/05/2019).    Ivy Lynn, NP

## 2019-06-04 NOTE — Assessment & Plan Note (Addendum)
Well controlled.  No changes to medication dose, refill sent to pharmacy Continue to work on eating a healthy diet and exercise.  No Labs drawn today.

## 2019-07-02 ENCOUNTER — Ambulatory Visit: Payer: BC Managed Care – PPO | Admitting: Nurse Practitioner

## 2019-07-06 NOTE — Progress Notes (Signed)
Subjective:  Patient ID: Judy George, female    DOB: Jan 29, 1981  Age: 39 y.o. MRN: [id number]  Chief Complaint  Patient presents with  . Weight Loss    HPI Patient is a 39 year old female who presents for follow-up of weight loss.  She is currently on phentermine 37.5 mg once daily in a.m.  Her weight has dropped from 182-163 pounds since beginning the medication.  She is eating healthy and exercising.  She denies any problems with elevated blood pressure, increased heart rate, or insomnia.  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  She has her blood checked approximately every 6 months.  She did previously see Dr. Gilman Buttner who explained that her anemia was only mild and that this should not really cause for her problem.  Social Hx   Social History   Socioeconomic History  . Marital status: Married    Spouse name: Not on file  . Number of children: 2  . Years of education: Not on file  . Highest education level: Not on file  Occupational History  . Occupation: Dentist  Tobacco Use  . Smoking status: Never Smoker  . Smokeless tobacco: Never Used  Substance and Sexual Activity  . Alcohol use: Never  . Drug use: Never  . Sexual activity: Yes  Other Topics Concern  . Not on file  Social History Narrative  . Not on file   Social Determinants of Health   Financial Resource Strain:   . Difficulty of Paying Living Expenses:   Food Insecurity:   . Worried About Programme researcher, broadcasting/film/video in the Last Year:   . Barista in the Last Year:   Transportation Needs:   . Freight forwarder (Medical):   Marland Kitchen Lack of Transportation (Non-Medical):   Physical Activity:   . Days of Exercise per Week:   . Minutes of Exercise per Session:   Stress:   . Feeling of Stress :   Social Connections:   . Frequency of Communication with Friends and Family:   . Frequency of Social Gatherings with Friends and Family:   . Attends Religious  Services:   . Active Member of Clubs or Organizations:   . Attends Banker Meetings:   Marland Kitchen Marital Status:    Past Medical History:  Diagnosis Date  . Alpha+ thalassemia   . Anemia   . Chronic constipation 06/05/2016  . Diastasis recti 11/30/2015  . History of colon polyps   . History of irregular heartbeat 06/05/2016  . Kidney stones    Family History  Problem Relation Age of Onset  . Hypertension Mother   . Colon polyps Mother   . Diabetes Maternal Aunt   . Heart attack Maternal Aunt   . Colon cancer Neg Hx   . Esophageal cancer Neg Hx     Review of Systems  Constitutional: Negative for chills, fatigue and fever.  HENT: Positive for voice change (hoarse). Negative for congestion, ear pain, rhinorrhea and sore throat.   Respiratory: Negative for cough and shortness of breath.   Cardiovascular: Negative for chest pain.  Gastrointestinal: Positive for constipation (chronic.  Currently on Linzess to 290 mg once daily in a.m.). Negative for abdominal pain, diarrhea, nausea and vomiting.  Genitourinary: Negative for dysuria and urgency.  Musculoskeletal: Negative for back pain and myalgias.  Neurological: Negative for dizziness, weakness, light-headedness and headaches.  Psychiatric/Behavioral: Negative for dysphoric mood. The patient is not nervous/anxious.  Objective:  BP 118/70   Pulse 97   Temp (!) 97.5 F (36.4 C) (Temporal)   Ht 5\' 2"  (1.575 m)   Wt 162 lb (73.5 kg)   LMP 06/16/2019   SpO2 97%   Breastfeeding Unknown   BMI 29.63 kg/m   BP/Weight 07/07/2019 06/04/2019 34/03/9377  Systolic BP 024 097 353  Diastolic BP 70 72 70  Wt. (Lbs) 162 163 185  BMI 29.63 29.81 33.84    Physical Exam Vitals reviewed.  Constitutional:      Appearance: Normal appearance. She is obese.  Cardiovascular:     Rate and Rhythm: Normal rate and regular rhythm.     Heart sounds: Normal heart sounds.  Pulmonary:     Effort: Pulmonary effort is normal. No  respiratory distress.     Breath sounds: Normal breath sounds.  Abdominal:     General: Abdomen is flat. Bowel sounds are normal.     Palpations: Abdomen is soft.     Tenderness: There is no abdominal tenderness.  Neurological:     Mental Status: She is alert.  Psychiatric:        Mood and Affect: Mood normal.        Behavior: Behavior normal.     No results found for: WBC, HGB, HCT, PLT, GLUCOSE, CHOL, TRIG, HDL, LDLDIRECT, LDLCALC, ALT, AST, NA, K, CL, CREATININE, BUN, CO2, TSH, PSA, INR, GLUF, HGBA1C, MICROALBUR    Assessment & Plan:  1. Chronic idiopathic constipation Continue Linzess to 290 mg once daily in a.m.  2. Class 1 obesity due to excess calories without serious comorbidity with body mass index (BMI) of 30.0 to 30.9 in adult Continue to eat healthy and exercise. - phentermine (ADIPEX-P) 37.5 MG tablet; Take 1 tablet (37.5 mg total) by mouth daily.  Dispense: 30 tablet; Refill: 2  3. Thalassemia minor Education discussed with patient concerning thalassemia minor.  Meds ordered this encounter  Medications  . phentermine (ADIPEX-P) 37.5 MG tablet    Sig: Take 1 tablet (37.5 mg total) by mouth daily.    Dispense:  30 tablet    Refill:  2  . linaclotide (LINZESS) 290 MCG CAPS capsule    Sig: Take 1 capsule (290 mcg total) by mouth daily.    Dispense:  90 capsule    Refill:  1   Follow-up: Return in about 10 weeks (around 09/15/2019) for CPE fasting.  An After Visit Summary was printed and given to the patient.  Rochel Brome [facility] [phone]

## 2019-07-07 ENCOUNTER — Encounter: Payer: Self-pay | Admitting: Family Medicine

## 2019-07-07 ENCOUNTER — Ambulatory Visit: Payer: BC Managed Care – PPO | Admitting: Family Medicine

## 2019-07-07 ENCOUNTER — Other Ambulatory Visit: Payer: Self-pay

## 2019-07-07 ENCOUNTER — Ambulatory Visit: Payer: BC Managed Care – PPO | Admitting: Nurse Practitioner

## 2019-07-07 VITALS — BP 118/70 | HR 97 | Temp 97.5°F | Ht 62.0 in | Wt 162.0 lb

## 2019-07-07 DIAGNOSIS — Z6830 Body mass index (BMI) 30.0-30.9, adult: Secondary | ICD-10-CM

## 2019-07-07 DIAGNOSIS — D563 Thalassemia minor: Secondary | ICD-10-CM

## 2019-07-07 DIAGNOSIS — K5904 Chronic idiopathic constipation: Secondary | ICD-10-CM | POA: Diagnosis not present

## 2019-07-07 DIAGNOSIS — E6609 Other obesity due to excess calories: Secondary | ICD-10-CM

## 2019-07-07 MED ORDER — LINACLOTIDE 290 MCG PO CAPS: 290.0000 ug | ORAL_CAPSULE | Freq: Every day | ORAL | 1 refills | Status: DC

## 2019-07-07 MED ORDER — PHENTERMINE HCL 37.5 MG PO TABS: 37.5000 mg | ORAL_TABLET | Freq: Every day | ORAL | 2 refills | Status: DC

## 2019-07-07 NOTE — Patient Instructions (Signed)
Healthy Eating Following a healthy eating pattern may help you to achieve and maintain a healthy body weight, reduce the risk of chronic disease, and live a long and productive life. It is important to follow a healthy eating pattern at an appropriate calorie level for your body. Your nutritional needs should be met primarily through food by choosing a variety of nutrient-rich foods. What are tips for following this plan? Reading food labels  Read labels and choose the following: ? Reduced or low sodium. ? Juices with 100% fruit juice. ? Foods with low saturated fats and high polyunsaturated and monounsaturated fats. ? Foods with whole grains, such as whole wheat, cracked wheat, brown rice, and wild rice. ? Whole grains that are fortified with folic acid. This is recommended for women who are pregnant or who want to become pregnant.  Read labels and avoid the following: ? Foods with a lot of added sugars. These include foods that contain brown sugar, corn sweetener, corn syrup, dextrose, fructose, glucose, high-fructose corn syrup, honey, invert sugar, lactose, malt syrup, maltose, molasses, raw sugar, sucrose, trehalose, or turbinado sugar.  Do not eat more than the following amounts of added sugar per day:  6 teaspoons (25 g) for women.  9 teaspoons (38 g) for men. ? Foods that contain processed or refined starches and grains. ? Refined grain products, such as white flour, degermed cornmeal, white bread, and white rice. Shopping  Choose nutrient-rich snacks, such as vegetables, whole fruits, and nuts. Avoid high-calorie and high-sugar snacks, such as potato chips, fruit snacks, and candy.  Use oil-based dressings and spreads on foods instead of solid fats such as butter, stick margarine, or cream cheese.  Limit pre-made sauces, mixes, and "instant" products such as flavored rice, instant noodles, and ready-made pasta.  Try more plant-protein sources, such as tofu, tempeh, black beans,  edamame, lentils, nuts, and seeds.  Explore eating plans such as the Mediterranean diet or vegetarian diet. Cooking  Use oil to saut or stir-fry foods instead of solid fats such as butter, stick margarine, or lard.  Try baking, boiling, grilling, or broiling instead of frying.  Remove the fatty part of meats before cooking.  Steam vegetables in water or broth. Meal planning   At meals, imagine dividing your plate into fourths: ? One-half of your plate is fruits and vegetables. ? One-fourth of your plate is whole grains. ? One-fourth of your plate is protein, especially lean meats, poultry, eggs, tofu, beans, or nuts.  Include low-fat dairy as part of your daily diet. Lifestyle  Choose healthy options in all settings, including home, work, school, restaurants, or stores.  Prepare your food safely: ? Wash your hands after handling raw meats. ? Keep food preparation surfaces clean by regularly washing with hot, soapy water. ? Keep raw meats separate from ready-to-eat foods, such as fruits and vegetables. ? Cook seafood, meat, poultry, and eggs to the recommended internal temperature. ? Store foods at safe temperatures. In general:  Keep cold foods at 59F (4.4C) or below.  Keep hot foods at 159F (60C) or above.  Keep your freezer at [facility] (-17.8C) or below.  Foods are no longer safe to eat when they have been between the temperatures of 40-159F (4.4-60C) for more than 2 hours. What foods should I eat? Fruits Aim to eat 2 cup-equivalents of fresh, canned (in natural juice), or frozen fruits each day. Examples of 1 cup-equivalent of fruit include 1 small apple, 8 large strawberries, 1 cup canned fruit,  cup  dried fruit, or 1 cup 100% juice. Vegetables Aim to eat 2-3 cup-equivalents of fresh and frozen vegetables each day, including different varieties and colors. Examples of 1 cup-equivalent of vegetables include 2 medium carrots, 2 cups raw, leafy greens, 1 cup chopped  vegetable (raw or cooked), or 1 medium baked potato. Grains Aim to eat 6 ounce-equivalents of whole grains each day. Examples of 1 ounce-equivalent of grains include 1 slice of bread, 1 cup ready-to-eat cereal, 3 cups popcorn, or  cup cooked rice, pasta, or cereal. Meats and other proteins Aim to eat 5-6 ounce-equivalents of protein each day. Examples of 1 ounce-equivalent of protein include 1 egg, 1/2 cup nuts or seeds, or 1 tablespoon (16 g) peanut butter. A cut of meat or fish that is the size of a deck of cards is about 3-4 ounce-equivalents.  Of the protein you eat each week, try to have at least 8 ounces come from seafood. This includes salmon, trout, herring, and anchovies. Dairy Aim to eat 3 cup-equivalents of fat-free or low-fat dairy each day. Examples of 1 cup-equivalent of dairy include 1 cup (240 mL) milk, 8 ounces (250 g) yogurt, 1 ounces (44 g) natural cheese, or 1 cup (240 mL) fortified soy milk. Fats and oils  Aim for about 5 teaspoons (21 g) per day. Choose monounsaturated fats, such as canola and olive oils, avocados, peanut butter, and most nuts, or polyunsaturated fats, such as sunflower, corn, and soybean oils, walnuts, pine nuts, sesame seeds, sunflower seeds, and flaxseed. Beverages  Aim for six 8-oz glasses of water per day. Limit coffee to three to five 8-oz cups per day.  Limit caffeinated beverages that have added calories, such as soda and energy drinks.  Limit alcohol intake to no more than 1 drink a day for nonpregnant women and 2 drinks a day for men. One drink equals 12 oz of beer (355 mL), 5 oz of wine (148 mL), or 1 oz of hard liquor (44 mL). Seasoning and other foods  Avoid adding excess amounts of salt to your foods. Try flavoring foods with herbs and spices instead of salt.  Avoid adding sugar to foods.  Try using oil-based dressings, sauces, and spreads instead of solid fats. This information is based on general [place] nutrition guidelines. For more  information, visit [employer]. Exact amounts may vary based on your nutrition needs. Summary  A healthy eating plan may help you to maintain a healthy weight, reduce the risk of chronic diseases, and stay active throughout your life.  Plan your meals. Make sure you eat the right portions of a variety of nutrient-rich foods.  Try baking, boiling, grilling, or broiling instead of frying.  Choose healthy options in all settings, including home, work, school, restaurants, or stores. This information is not intended to replace advice given to you by your health care provider. Make sure you discuss any questions you have with your health care provider. Document Revised: 06/17/2017 Document Reviewed: 06/17/2017 Elsevier Patient Education  [place].

## 2019-07-08 ENCOUNTER — Ambulatory Visit: Payer: BC Managed Care – PPO | Admitting: Nurse Practitioner

## 2019-07-12 DIAGNOSIS — D563 Thalassemia minor: Secondary | ICD-10-CM | POA: Insufficient documentation

## 2019-09-15 ENCOUNTER — Encounter: Payer: BC Managed Care – PPO | Admitting: Family Medicine

## 2019-10-02 ENCOUNTER — Encounter: Payer: BC Managed Care – PPO | Admitting: Family Medicine

## 2019-10-06 ENCOUNTER — Encounter: Payer: BC Managed Care – PPO | Admitting: Family Medicine

## 2019-10-23 ENCOUNTER — Other Ambulatory Visit: Payer: Self-pay

## 2019-10-23 ENCOUNTER — Ambulatory Visit: Payer: BC Managed Care – PPO | Admitting: Legal Medicine

## 2019-10-23 ENCOUNTER — Encounter: Payer: Self-pay | Admitting: Legal Medicine

## 2019-10-23 VITALS — BP 110/76 | HR 100 | Temp 97.5°F | Resp 16 | Ht 62.0 in | Wt 162.4 lb

## 2019-10-23 DIAGNOSIS — R5383 Other fatigue: Secondary | ICD-10-CM

## 2019-10-23 DIAGNOSIS — R1084 Generalized abdominal pain: Secondary | ICD-10-CM | POA: Diagnosis not present

## 2019-10-23 DIAGNOSIS — R109 Unspecified abdominal pain: Secondary | ICD-10-CM | POA: Insufficient documentation

## 2019-10-23 NOTE — Progress Notes (Signed)
Subjective:  Patient ID: Judy George, female    DOB: 1981-02-16  Age: 39 y.o. MRN: [id number]  Chief Complaint  Patient presents with  . Generalized Body Aches    HPI: patient has been having body aches in arms and thighes for 3 months.  No joint swelling or inflammation.  She feels tired.  Weight loss from phenteramine.  Vague abdomina pain.  No nausea or vomiting.  She is using linzess. She has been  Using google and worried about pancreatitis and cancer.   Current Outpatient Medications on File Prior to Visit  Medication Sig Dispense Refill  . linaclotide (LINZESS) 290 MCG CAPS capsule Take 1 capsule (290 mcg total) by mouth daily. 90 capsule 1  . omeprazole (PRILOSEC) 20 MG capsule Take 1 capsule (20 mg total) by mouth daily. 30 capsule 11  . phentermine (ADIPEX-P) 37.5 MG tablet Take 1 tablet (37.5 mg total) by mouth daily. 30 tablet 2   No current facility-administered medications on file prior to visit.   Past Medical History:  Diagnosis Date  . Alpha+ thalassemia   . Anemia   . Chronic constipation 06/05/2016  . Diastasis recti 11/30/2015  . History of colon polyps   . History of irregular heartbeat 06/05/2016  . Kidney stones    Past Surgical History:  Procedure Laterality Date  . CESAREAN SECTION     X3  . COLONOSCOPY  2016   Dr Judie Petit in [place] 1 removed and it was non cancerous   . HERNIA REPAIR  04/2013  . TUBAL LIGATION      Family History  Problem Relation Age of Onset  . Hypertension Mother   . Colon polyps Mother   . Diabetes Maternal Aunt   . Heart attack Maternal Aunt   . Colon cancer Neg Hx   . Esophageal cancer Neg Hx    Social History   Socioeconomic History  . Marital status: Married    Spouse name: Not on file  . Number of children: 2  . Years of education: Not on file  . Highest education level: Not on file  Occupational History  . Occupation: Dentist  Tobacco Use  . Smoking status: Never Smoker  . Smokeless  tobacco: Never Used  Vaping Use  . Vaping Use: Never used  Substance and Sexual Activity  . Alcohol use: Never  . Drug use: Never  . Sexual activity: Yes  Other Topics Concern  . Not on file  Social History Narrative  . Not on file   Social Determinants of Health   Financial Resource Strain:   . Difficulty of Paying Living Expenses:   Food Insecurity:   . Worried About Programme researcher, broadcasting/film/video in the Last Year:   . Barista in the Last Year:   Transportation Needs:   . Freight forwarder (Medical):   Marland Kitchen Lack of Transportation (Non-Medical):   Physical Activity:   . Days of Exercise per Week:   . Minutes of Exercise per Session:   Stress:   . Feeling of Stress :   Social Connections:   . Frequency of Communication with Friends and Family:   . Frequency of Social Gatherings with Friends and Family:   . Attends Religious Services:   . Active Member of Clubs or Organizations:   . Attends Banker Meetings:   Marland Kitchen Marital Status:     Review of Systems  Constitutional: Negative.   HENT: Negative.   Eyes: Negative.  Respiratory: Negative.   Cardiovascular: Negative.   Gastrointestinal: Positive for abdominal pain.  Endocrine: Negative.   Genitourinary: Negative.   Musculoskeletal: Positive for arthralgias and myalgias.  Neurological: Negative.   Psychiatric/Behavioral: Negative.      Objective:  BP 110/76   Pulse 100   Temp (!) 97.5 F (36.4 C)   Resp 16   Ht 5\' 2"  (1.575 m)   Wt 162 lb 6.4 oz (73.7 kg)   SpO2 99%   BMI 29.70 kg/m   BP/Weight 10/23/2019 07/07/2019 06/04/2019  Systolic BP 110 118 108  Diastolic BP 76 70 72  Wt. (Lbs) 162.4 162 163  BMI 29.7 29.63 29.81    Physical Exam Vitals reviewed.  Constitutional:      Appearance: Normal appearance.  HENT:     Head: Normocephalic and atraumatic.     Right Ear: Tympanic membrane, ear canal and external ear normal.     Left Ear: Tympanic membrane, ear canal and external ear normal.      Mouth/Throat:     Mouth: Mucous membranes are moist.  Eyes:     Extraocular Movements: Extraocular movements intact.     Conjunctiva/sclera: Conjunctivae normal.     Pupils: Pupils are equal, round, and reactive to light.  Cardiovascular:     Rate and Rhythm: Normal rate and regular rhythm.     Pulses: Normal pulses.     Heart sounds: Normal heart sounds.  Pulmonary:     Effort: Pulmonary effort is normal.     Breath sounds: Normal breath sounds.  Abdominal:     General: Abdomen is flat. Bowel sounds are normal.     Palpations: Abdomen is soft.  Musculoskeletal:        General: Normal range of motion.     Cervical back: Normal range of motion and neck supple.     Comments: No joint swelling or tenderness  Skin:    General: Skin is warm.     Capillary Refill: Capillary refill takes less than 2 seconds.  Neurological:     General: No focal deficit present.     Mental Status: She is alert. Mental status is at baseline.  Psychiatric:        Mood and Affect: Mood normal.        Thought Content: Thought content normal.        Judgment: Judgment normal.     Diabetic Foot Exam - Simple   No data filed       Lab Results  Component Value Date   WBC 4.7 10/23/2019   HGB 10.7 (L) 10/23/2019   HCT 34.7 10/23/2019   PLT 308 10/23/2019   GLUCOSE 88 10/23/2019   ALT 9 10/23/2019   AST 18 10/23/2019   NA 140 10/23/2019   K 4.8 10/23/2019   CL 101 10/23/2019   CREATININE 0.68 10/23/2019   BUN 7 10/23/2019   CO2 24 10/23/2019   TSH 0.968 10/23/2019      Assessment & Plan:   1. Generalized abdominal pain - CBC with Differential/Platelet - Comprehensive metabolic panel - TSH - C-reactive protein - Lipase - CT Abdomen Pelvis W Contrast; Future AN INDIVIDUAL CARE PLAN for generalized abdominal pain  was established and reinforced today.  The patient's status was assessed using clinical findings on exam, labs, and other diagnostic testing. Patient's success at meeting  treatment goals based on disease specific evidence-bassed guidelines and found to be in fair control. RECOMMENDATIONS include maintaining present medicines and treatment.  2. Fatigue,  unspecified type We will test for inflammatory process and thyroid    No orders of the defined types were placed in this encounter.   Orders Placed This Encounter  Procedures  . CT Abdomen Pelvis W Contrast  . CBC with Differential/Platelet  . Comprehensive metabolic panel  . TSH  . C-reactive protein  . Lipase     Follow-up: Return for with Dr. Sedalia Muta.  An After Visit Summary was printed and given to the patient.  [facility] [phone]

## 2019-10-24 LAB — COMPREHENSIVE METABOLIC PANEL
ALT: 9 IU/L (ref 0–32)
AST: 18 IU/L (ref 0–40)
Albumin/Globulin Ratio: 1.5 (ref 1.2–2.2)
Albumin: 4.3 g/dL (ref 3.8–4.8)
Alkaline Phosphatase: 72 IU/L (ref 48–121)
BUN/Creatinine Ratio: 10 (ref 9–23)
BUN: 7 mg/dL (ref 6–24)
Bilirubin Total: 0.3 mg/dL (ref 0.0–1.2)
CO2: 24 mmol/L (ref 20–29)
Calcium: 9.3 mg/dL (ref 8.7–10.2)
Chloride: 101 mmol/L (ref 96–106)
Creatinine, Ser: 0.68 mg/dL (ref 0.57–1.00)
GFR calc Af Amer: 126 mL/min/{1.73_m2} (ref >59)
GFR calc non Af Amer: 109 mL/min/{1.73_m2} (ref >59)
Globulin, Total: 2.9 g/dL (ref 1.5–4.5)
Glucose: 88 mg/dL (ref 65–99)
Potassium: 4.8 mmol/L (ref 3.5–5.2)
Sodium: 140 mmol/L (ref 134–144)
Total Protein: 7.2 g/dL (ref 6.0–8.5)

## 2019-10-24 LAB — CBC WITH DIFFERENTIAL/PLATELET
Basophils Absolute: 0 10*3/uL (ref 0.0–0.2)
Basos: 1 %
EOS (ABSOLUTE): 0.1 10*3/uL (ref 0.0–0.4)
Eos: 1 %
Hematocrit: 34.7 % (ref 34.0–46.6)
Hemoglobin: 10.7 g/dL — ABNORMAL LOW (ref 11.1–15.9)
Immature Grans (Abs): 0 10*3/uL (ref 0.0–0.1)
Immature Granulocytes: 0 %
Lymphocytes Absolute: 2.2 10*3/uL (ref 0.7–3.1)
Lymphs: 46 %
MCH: 22.1 pg — ABNORMAL LOW (ref 26.6–33.0)
MCHC: 30.8 g/dL — ABNORMAL LOW (ref 31.5–35.7)
MCV: 72 fL — ABNORMAL LOW (ref 79–97)
Monocytes Absolute: 0.3 10*3/uL (ref 0.1–0.9)
Monocytes: 7 %
Neutrophils Absolute: 2.1 10*3/uL (ref 1.4–7.0)
Neutrophils: 45 %
Platelets: 308 10*3/uL (ref 150–450)
RBC: 4.84 x10E6/uL (ref 3.77–5.28)
RDW: 14.7 % (ref 11.7–15.4)
WBC: 4.7 10*3/uL (ref 3.4–10.8)

## 2019-10-24 LAB — LIPASE: Lipase: 24 U/L (ref 14–72)

## 2019-10-24 LAB — TSH: TSH: 0.968 u[IU]/mL (ref 0.450–4.500)

## 2019-10-24 LAB — C-REACTIVE PROTEIN: CRP: <1 mg/L (ref 0–10)

## 2019-10-25 NOTE — Progress Notes (Signed)
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

## 2019-10-29 ENCOUNTER — Ambulatory Visit (INDEPENDENT_AMBULATORY_CARE_PROVIDER_SITE_OTHER): Payer: BC Managed Care – PPO | Admitting: Family Medicine

## 2019-10-29 ENCOUNTER — Encounter: Payer: Self-pay | Admitting: Family Medicine

## 2019-10-29 ENCOUNTER — Other Ambulatory Visit: Payer: Self-pay

## 2019-10-29 VITALS — BP 120/70 | HR 69 | Temp 97.4°F | Resp 17 | Ht 62.0 in | Wt 161.0 lb

## 2019-10-29 DIAGNOSIS — Z23 Encounter for immunization: Secondary | ICD-10-CM | POA: Diagnosis not present

## 2019-10-29 DIAGNOSIS — E663 Overweight: Secondary | ICD-10-CM

## 2019-10-29 DIAGNOSIS — Z0000 Encounter for general adult medical examination without abnormal findings: Secondary | ICD-10-CM | POA: Diagnosis not present

## 2019-10-29 DIAGNOSIS — Z1231 Encounter for screening mammogram for malignant neoplasm of breast: Secondary | ICD-10-CM

## 2019-10-29 DIAGNOSIS — K429 Umbilical hernia without obstruction or gangrene: Secondary | ICD-10-CM

## 2019-10-29 DIAGNOSIS — Z6829 Body mass index (BMI) 29.0-29.9, adult: Secondary | ICD-10-CM

## 2019-10-29 LAB — POCT URINALYSIS DIP (CLINITEK)
Bilirubin, UA: NEGATIVE
Blood, UA: NEGATIVE
Glucose, UA: NEGATIVE mg/dL
Ketones, POC UA: NEGATIVE mg/dL
Leukocytes, UA: NEGATIVE
Nitrite, UA: NEGATIVE
POC PROTEIN,UA: NEGATIVE
Spec Grav, UA: 1.015 (ref 1.010–1.025)
Urobilinogen, UA: 1 E.U./dL
pH, UA: 6 (ref 5.0–8.0)

## 2019-10-29 NOTE — Progress Notes (Signed)
Subjective:  Patient ID: Judy George, female    DOB: 1980-08-16  Age: 39 y.o. MRN: [id number]  Chief Complaint  Patient presents with   Annual Exam    HPI  Well Adult Physical: Patient here for a comprehensive physical exam.The patient reports no problems.  Do you take any herbs or supplements that were not prescribed by a doctor? no. Are you taking calcium supplements? No. Are you taking aspirin daily? no  Encounter for general adult medical examination without abnormal findings  Physical ("At Risk" items are starred): Patient's last physical exam was 1 year ago .  Weight: Overweight BMI 29.45 kg/m2 ;  Blood Pressure: Normal (BP less than 120/80) ;  Medical History: Patient history reviewed ; Family history reviewed ;  Allergies Reviewed: No change in current allergies ;  Medications Reviewed: Medications reviewed - no changes ;  Lipids: Normal lipid levels ;  Smoking: Life-long non-smoker ;  Physical Activity: Exercises at least 3 times per week ;  Alcohol/Drug Use: Is a non-drinker ; No illicit drug use ;  Patient is not afflicted from Stress Incontinence and Urge Incontinence  Safety: reviewed ; Patient wears a seat belt, has smoke detectors, has carbon monoxide detectors, practices appropriate gun safety, and wears sunscreen with extended sun exposure. Dental Care: Ophthalmology/Optometry: Annual visit.  Hearing loss: none Vision impairments: none  Menarche: 12 Menstrual History: Regular LMP: 10/10/2019 Pregnancy history: 5 pregnancies and 2 miscarriages Safe at home: yes Self breast exams: no  Mammogram last year.  She apparently had an umbilical hernia repair around 2015 and then a ventral hernia came 9-12 months after the surgery. This was repaired and then over the last few weeks she began having pain in her abdomen.Initial surgery was performed by Dr. Dimas Chyle and then subsequent surgery was performed by Dr. Vinson Moselle.    Office Visit from 07/07/2019 in [facility]  PHQ-2 Total Score 0       Social Hx   Social History   Socioeconomic History   Marital status: Married    Spouse name: Not on file   Number of children: 2   Years of education: Not on file   Highest education level: Not on file  Occupational History   Occupation: Dentist  Tobacco Use   Smoking status: Never Smoker   Smokeless tobacco: Never Used  Building services engineer Use: Never used  Substance and Sexual Activity   Alcohol use: Never   Drug use: Never   Sexual activity: Yes    Partners: Male    Birth control/protection: Surgical  Other Topics Concern   Not on file  Social History Narrative   Not on file   Social Determinants of Health   Financial Resource Strain:    Difficulty of Paying Living Expenses:   Food Insecurity:    Worried About Programme researcher, broadcasting/film/video in the Last Year:    Barista in the Last Year:   Transportation Needs:    Freight forwarder (Medical):    Lack of Transportation (Non-Medical):   Physical Activity:    Days of Exercise per Week:    Minutes of Exercise per Session:   Stress:    Feeling of Stress :   Social Connections:    Frequency of Communication with Friends and Family:    Frequency of Social Gatherings with Friends and Family:    Attends Religious Services:    Active Member of Clubs or Organizations:  Attends Banker Meetings:    Marital Status:    Past Medical History:  Diagnosis Date   Alpha+ thalassemia    Anemia    Chronic constipation 06/05/2016   Diastasis recti 11/30/2015   History of colon polyps    History of irregular heartbeat 06/05/2016   Kidney stones    Past Surgical History:  Procedure Laterality Date   CESAREAN SECTION     X3   COLONOSCOPY  2016   Dr Judie Petit in [place] 1 removed and it was non cancerous    HERNIA REPAIR  04/2013   TUBAL LIGATION      Family History  Problem Relation Age of Onset   Hypertension  Mother    Colon polyps Mother    Diabetes Maternal Aunt    Heart attack Maternal Aunt    Colon cancer Neg Hx    Esophageal cancer Neg Hx     Review of Systems  Constitutional: Negative for chills, fatigue and fever.  HENT: Negative for ear pain.   Respiratory: Negative for apnea, cough and shortness of breath.   Cardiovascular: Negative for chest pain.  Gastrointestinal: Positive for abdominal pain and nausea.  Genitourinary: Negative for dyspareunia, dysuria, flank pain, pelvic pain, vaginal discharge and vaginal pain.  Musculoskeletal: Negative for back pain.  Neurological: Negative for dizziness and headaches.     Objective:  BP 120/70 (BP Location: Right Arm, Patient Position: Sitting)    Pulse 69    Temp (!) 97.4 F (36.3 C) (Temporal)    Resp 17    Ht 5\' 2"  (1.575 m)    Wt 161 lb (73 kg)    SpO2 100%    BMI 29.45 kg/m   BP/Weight 10/29/2019 10/23/2019 07/07/2019  Systolic BP 120 110 118  Diastolic BP 70 76 70  Wt. (Lbs) 161 162.4 162  BMI 29.45 29.7 29.63    Physical Exam Vitals reviewed.  Constitutional:      General: She is not in acute distress.    Appearance: Normal appearance. She is obese.  HENT:     Right Ear: Tympanic membrane and ear canal normal.     Left Ear: Tympanic membrane and ear canal normal.     Nose: Nose normal. No congestion or rhinorrhea.  Eyes:     Conjunctiva/sclera: Conjunctivae normal.  Neck:     Thyroid: No thyroid mass.     Vascular: No carotid bruit.  Cardiovascular:     Rate and Rhythm: Normal rate and regular rhythm.     Pulses: Normal pulses.     Heart sounds: No murmur heard.   Pulmonary:     Effort: Pulmonary effort is normal.     Breath sounds: Normal breath sounds.  Chest:     Breasts:        Right: Normal.        Left: Inverted nipple present.  Abdominal:     General: Bowel sounds are normal.     Palpations: Abdomen is soft. There is no mass.     Tenderness: There is no abdominal tenderness.     Hernia: A hernia  (umbilical) is present.  Musculoskeletal:        General: Normal range of motion.  Lymphadenopathy:     Cervical: No cervical adenopathy.     Upper Body:     Right upper body: No axillary adenopathy.     Left upper body: No axillary adenopathy.  Skin:    General: Skin is warm and dry.  Neurological:     Mental Status: She is alert and oriented to person, place, and time.     Cranial Nerves: No cranial nerve deficit.  Psychiatric:        Mood and Affect: Mood normal.        Behavior: Behavior normal.     Lab Results  Component Value Date   WBC 4.7 10/23/2019   HGB 10.7 (L) 10/23/2019   HCT 34.7 10/23/2019   PLT 308 10/23/2019   GLUCOSE 88 10/23/2019   ALT 9 10/23/2019   AST 18 10/23/2019   NA 140 10/23/2019   K 4.8 10/23/2019   CL 101 10/23/2019   CREATININE 0.68 10/23/2019   BUN 7 10/23/2019   CO2 24 10/23/2019   TSH 0.968 10/23/2019      Assessment & Plan:  1. Annual physical exam Education given. - Lipid Panel - POCT URINALYSIS DIP (CLINITEK)  2. Need for diphtheria-tetanus-pertussis (Tdap) vaccine - Tdap vaccine greater than or equal to 7yo IM   3. Overweight with body mass index (BMI) of 29 to 29.9 in adult Recommend continue to work on eating healthy diet and exercise.  4. Umbilical hernia without obstruction and without gangrene Refer to general surgeon, Dr. Vinson Moselle.  She apparently had an umbilical hernia repair and then an ventral hernia came 9-12 months after the surgery. This was repaired and then over the last few weeks she began having pain in her abdomen.  5. Visit for screening mammogram Order mammogram.  Body mass index is 29.45 kg/m.   This is a list of the screening recommended for you and due dates:  Health Maintenance  Topic Date Due    Hepatitis C: One time screening is recommended by Center for Disease Control  (CDC) for  adults born from [birth year] through 1967.   Never done   COVID-19 Vaccine (1) Never done   HIV Screening  Never  done   Tetanus Vaccine  Never done   Pap Smear  Never done   Flu Shot  10/18/2019     AN INDIVIDUALIZED CARE PLAN: was established or reinforced today.   SELF MANAGEMENT: The patient and I together assessed ways to personally work towards obtaining the recommended goals  Support needs The patient and/or family needs were assessed and services were offered if appropriate.  No orders of the defined types were placed in this encounter.   Follow-up: No follow-ups on file.  An After Visit Summary was printed and given to the patient.  Eugenie Norrie [facility] [phone]

## 2019-10-29 NOTE — Patient Instructions (Signed)
Refer to general surgeon for umbilical hernia.  Ordering mammogram.   Preventive Care 108-41 Years Old, Female Preventive care refers to visits with your health care provider and lifestyle choices that can promote health and wellness. This includes:  A yearly physical exam. This may also be called an annual well check.  Regular dental visits and eye exams.  Immunizations.  Screening for certain conditions.  Healthy lifestyle choices, such as eating a healthy diet, getting regular exercise, not using drugs or products that contain nicotine and tobacco, and limiting alcohol use. What can I expect for my preventive care visit? Physical exam Your health care provider will check your:  Height and weight. This may be used to calculate body mass index (BMI), which tells if you are at a healthy weight.  Heart rate and blood pressure.  Skin for abnormal spots. Counseling Your health care provider may ask you questions about your:  Alcohol, tobacco, and drug use.  Emotional well-being.  Home and relationship well-being.  Sexual activity.  Eating habits.  Work and work Statistician.  Method of birth control.  Menstrual cycle.  Pregnancy history. What immunizations do I need?  Influenza (flu) vaccine  This is recommended every year. Tetanus, diphtheria, and pertussis (Tdap) vaccine  You may need a Td booster every 10 years. Varicella (chickenpox) vaccine  You may need this if you have not been vaccinated. Zoster (shingles) vaccine  You may need this after age 93. Measles, mumps, and rubella (MMR) vaccine  You may need at least one dose of MMR if you were born in 1957 or later. You may also need a second dose. Pneumococcal conjugate (PCV13) vaccine  You may need this if you have certain conditions and were not previously vaccinated. Pneumococcal polysaccharide (PPSV23) vaccine  You may need one or two doses if you smoke cigarettes or if you have certain  conditions. Meningococcal conjugate (MenACWY) vaccine  You may need this if you have certain conditions. Hepatitis A vaccine  You may need this if you have certain conditions or if you travel or work in places where you may be exposed to hepatitis A. Hepatitis B vaccine  You may need this if you have certain conditions or if you travel or work in places where you may be exposed to hepatitis B. Haemophilus influenzae type b (Hib) vaccine  You may need this if you have certain conditions. Human papillomavirus (HPV) vaccine  If recommended by your health care provider, you may need three doses over 6 months. You may receive vaccines as individual doses or as more than one vaccine together in one shot (combination vaccines). Talk with your health care provider about the risks and benefits of combination vaccines. What tests do I need? Blood tests  Lipid and cholesterol levels. These may be checked every 5 years, or more frequently if you are over 24 years old.  Hepatitis C test.  Hepatitis B test. Screening  Lung cancer screening. You may have this screening every year starting at age 53 if you have a 30-pack-year history of smoking and currently smoke or have quit within the past 15 years.  Colorectal cancer screening. All adults should have this screening starting at age 58 and continuing until age 7. Your health care provider may recommend screening at age 93 if you are at increased risk. You will have tests every 1-10 years, depending on your results and the type of screening test.  Diabetes screening. This is done by checking your blood sugar (glucose) after  you have not eaten for a while (fasting). You may have this done every 1-3 years.  Mammogram. This may be done every 1-2 years. Talk with your health care provider about when you should start having regular mammograms. This may depend on whether you have a family history of breast cancer.  BRCA-related cancer screening. This  may be done if you have a family history of breast, ovarian, tubal, or peritoneal cancers.  Pelvic exam and Pap test. This may be done every 3 years starting at age 65. Starting at age 36, this may be done every 5 years if you have a Pap test in combination with an HPV test. Other tests  Sexually transmitted disease (STD) testing.  Bone density scan. This is done to screen for osteoporosis. You may have this scan if you are at high risk for osteoporosis. Follow these instructions at home: Eating and drinking  Eat a diet that includes fresh fruits and vegetables, whole grains, lean protein, and low-fat dairy.  Take vitamin and mineral supplements as recommended by your health care provider.  Do not drink alcohol if: ? Your health care provider tells you not to drink. ? You are pregnant, may be pregnant, or are planning to become pregnant.  If you drink alcohol: ? Limit how much you have to 0-1 drink a day. ? Be aware of how much alcohol is in your drink. In the [place] one drink equals one 12 oz bottle of beer (355 mL), one 5 oz glass of wine (148 mL), or one 1 oz glass of hard liquor (44 mL). Lifestyle  Take daily care of your teeth and gums.  Stay active. Exercise for at least 30 minutes on 5 or more days each week.  Do not use any products that contain nicotine or tobacco, such as cigarettes, e-cigarettes, and chewing tobacco. If you need help quitting, ask your health care provider.  If you are sexually active, practice safe sex. Use a condom or other form of birth control (contraception) in order to prevent pregnancy and STIs (sexually transmitted infections).  If told by your health care provider, take low-dose aspirin daily starting at age 57. What's next?  Visit your health care provider once a year for a well check visit.  Ask your health care provider how often you should have your eyes and teeth checked.  Stay up to date on all vaccines. This information is not  intended to replace advice given to you by your health care provider. Make sure you discuss any questions you have with your health care provider. Document Revised: 11/14/2017 Document Reviewed: 11/14/2017 Elsevier Patient Education  2020 [employer].

## 2019-10-30 LAB — LIPID PANEL
Chol/HDL Ratio: 2.6 ratio (ref 0.0–4.4)
Cholesterol, Total: 193 mg/dL (ref 100–199)
HDL: 75 mg/dL (ref >39)
LDL Chol Calc (NIH): 109 mg/dL — ABNORMAL HIGH (ref 0–99)
Triglycerides: 44 mg/dL (ref 0–149)
VLDL Cholesterol Cal: 9 mg/dL (ref 5–40)

## 2019-10-30 LAB — CARDIOVASCULAR RISK ASSESSMENT

## 2019-11-10 ENCOUNTER — Other Ambulatory Visit: Payer: Self-pay | Admitting: Legal Medicine

## 2019-11-17 ENCOUNTER — Other Ambulatory Visit: Payer: Self-pay

## 2019-11-17 DIAGNOSIS — Z1231 Encounter for screening mammogram for malignant neoplasm of breast: Secondary | ICD-10-CM

## 2019-11-18 ENCOUNTER — Telehealth: Payer: Self-pay

## 2019-11-18 ENCOUNTER — Other Ambulatory Visit: Payer: Self-pay

## 2019-11-18 DIAGNOSIS — R1084 Generalized abdominal pain: Secondary | ICD-10-CM

## 2019-11-18 NOTE — Telephone Encounter (Signed)
I faxed CT abdomen and pelvis with contrast to [facility] on 11/18/2019. Patient was informed about the results of CT.

## 2019-12-07 ENCOUNTER — Telehealth (INDEPENDENT_AMBULATORY_CARE_PROVIDER_SITE_OTHER): Payer: BC Managed Care – PPO | Admitting: Family Medicine

## 2019-12-07 ENCOUNTER — Encounter: Payer: Self-pay | Admitting: Family Medicine

## 2019-12-07 VITALS — Ht 62.0 in | Wt 162.0 lb

## 2019-12-07 DIAGNOSIS — R059 Cough, unspecified: Secondary | ICD-10-CM

## 2019-12-07 DIAGNOSIS — J028 Acute pharyngitis due to other specified organisms: Secondary | ICD-10-CM

## 2019-12-07 DIAGNOSIS — R05 Cough: Secondary | ICD-10-CM

## 2019-12-07 LAB — POC COVID19 BINAXNOW: SARS Coronavirus 2 Ag: NEGATIVE

## 2019-12-07 LAB — POCT RAPID STREP A (OFFICE): Rapid Strep A Screen: NEGATIVE

## 2019-12-07 MED ORDER — AMOXICILLIN 875 MG PO TABS
875.0000 mg | ORAL_TABLET | Freq: Two times a day (BID) | ORAL | 0 refills | Status: DC
Start: 2019-12-07 — End: 2020-01-28

## 2019-12-07 NOTE — Progress Notes (Signed)
Virtual Visit via Telephone Note   This visit type was conducted due to national recommendations for restrictions regarding the COVID-19 Pandemic (e.g. social distancing) in an effort to limit this patient's exposure and mitigate transmission in our community.  Due to her co-morbid illnesses, this patient is at least at moderate risk for complications without adequate follow up.  This format is felt to be most appropriate for this patient at this time.  The patient did not have access to video technology/had technical difficulties with video requiring transitioning to audio format only (telephone).  All issues noted in this document were discussed and addressed.  No physical exam could be performed with this format.  Patient verbally consented to a telehealth visit.   Date:  12/07/2019   ID:  Judy George, Judy George 07-29-1980, MRN [id number]  Patient Location: Home Provider Location: Office/Clinic  PCP:  Blane Ohara, MD   Evaluation Performed:  Acute visit  Chief Complaint:  Sore throat  History of Present Illness:    Judy George is a 39 y.o. female with a sore throat started Friday. "Strep has been going around her classroom". Has tried chloraseptic spray and tylenol which have helped some. States her throat burns when she swallows and she has a "horrible taste in her mouth". Complaining of headache.   Past Medical History:  Diagnosis Date  . Alpha+ thalassemia   . Anemia   . Chronic constipation 06/05/2016  . Diastasis recti 11/30/2015  . History of colon polyps   . History of irregular heartbeat 06/05/2016  . Kidney stones     Past Surgical History:  Procedure Laterality Date  . CESAREAN SECTION     X3  . COLONOSCOPY  2016   Dr Judie Petit in [place] 1 removed and it was non cancerous   . HERNIA REPAIR  04/2013  . TUBAL LIGATION      Family History  Problem Relation Age of Onset  . Hypertension Mother   . Colon polyps Mother   . Diabetes Maternal Aunt   . Heart  attack Maternal Aunt   . Colon cancer Neg Hx   . Esophageal cancer Neg Hx     Social History   Socioeconomic History  . Marital status: Married    Spouse name: Not on file  . Number of children: 2  . Years of education: Not on file  . Highest education level: Not on file  Occupational History  . Occupation: Dentist  Tobacco Use  . Smoking status: Never Smoker  . Smokeless tobacco: Never Used  Vaping Use  . Vaping Use: Never used  Substance and Sexual Activity  . Alcohol use: Never  . Drug use: Never  . Sexual activity: Yes    Partners: Male    Birth control/protection: Surgical  Other Topics Concern  . Not on file  Social History Narrative  . Not on file   Social Determinants of Health   Financial Resource Strain:   . Difficulty of Paying Living Expenses: Not on file  Food Insecurity:   . Worried About Programme researcher, broadcasting/film/video in the Last Year: Not on file  . Ran Out of Food in the Last Year: Not on file  Transportation Needs:   . Lack of Transportation (Medical): Not on file  . Lack of Transportation (Non-Medical): Not on file  Physical Activity:   . Days of Exercise per Week: Not on file  . Minutes of Exercise per Session: Not on file  Stress:   .  Feeling of Stress : Not on file  Social Connections:   . Frequency of Communication with Friends and Family: Not on file  . Frequency of Social Gatherings with Friends and Family: Not on file  . Attends Religious Services: Not on file  . Active Member of Clubs or Organizations: Not on file  . Attends Banker Meetings: Not on file  . Marital Status: Not on file  Intimate Partner Violence:   . Fear of Current or Ex-Partner: Not on file  . Emotionally Abused: Not on file  . Physically Abused: Not on file  . Sexually Abused: Not on file    Outpatient Medications Prior to Visit  Medication Sig Dispense Refill  . linaclotide (LINZESS) 290 MCG CAPS capsule Take 1 capsule (290 mcg total) by mouth  daily. 90 capsule 1   No facility-administered medications prior to visit.   .med Allergies:   Patient has no known allergies.   Social History   Tobacco Use  . Smoking status: Never Smoker  . Smokeless tobacco: Never Used  Vaping Use  . Vaping Use: Never used  Substance Use Topics  . Alcohol use: Never  . Drug use: Never     Review of Systems  Constitutional: Negative for chills and fever.  HENT: Positive for congestion and sore throat (Burns when she swallow).   Respiratory: Positive for cough.   Musculoskeletal: Negative for neck pain.  Neurological: Positive for headaches.     Labs/Other Tests and Data Reviewed:    Recent Labs: 10/23/2019: ALT 9; BUN 7; Creatinine, Ser 0.68; Hemoglobin 10.7; Platelets 308; Potassium 4.8; Sodium 140; TSH 0.968   Recent Lipid Panel Lab Results  Component Value Date/Time   CHOL 193 10/29/2019 12:02 PM   TRIG 44 10/29/2019 12:02 PM   HDL 75 10/29/2019 12:02 PM   CHOLHDL 2.6 10/29/2019 12:02 PM   LDLCALC 109 (H) 10/29/2019 12:02 PM    Wt Readings from Last 3 Encounters:  12/07/19 162 lb (73.5 kg)  10/29/19 161 lb (73 kg)  10/23/19 162 lb 6.4 oz (73.7 kg)     Objective:    Vital Signs:  Ht 5\' 2"  (1.575 m)   Wt 162 lb (73.5 kg)   BMI 29.63 kg/m    Physical Exam   ASSESSMENT & PLAN:   1. Cough - covid 19 poct: negative - Novel Coronavirus, NAA ([facility])  2. Pharyngitis due to other organism - strep negative.  Amoxicillin rx  COVID-19 Education: The signs and symptoms of COVID-19 were discussed with the patient and how to seek care for testing (follow up with PCP or arrange E-visit). The importance of social distancing was discussed today.  Time:   Today, I have spent 15 minutes with the patient with telehealth technology discussing the above problems.    Follow Up:  Virtual Visit  prn  Signed, , CMA  12/07/2019 2:34 PM    Judy George 12/09/2019

## 2019-12-09 ENCOUNTER — Telehealth: Payer: BC Managed Care – PPO | Admitting: Family Medicine

## 2019-12-09 LAB — NOVEL CORONAVIRUS, NAA: SARS-CoV-2, NAA: NOT DETECTED

## 2019-12-09 LAB — SARS-COV-2, NAA 2 DAY TAT

## 2020-01-22 ENCOUNTER — Encounter: Payer: Self-pay | Admitting: Family Medicine

## 2020-01-27 ENCOUNTER — Telehealth: Payer: Self-pay | Admitting: Cardiology

## 2020-01-27 NOTE — Telephone Encounter (Signed)
Patient would like to switch from Dr. Tomie China to Dr. Servando Salina.

## 2020-01-28 ENCOUNTER — Encounter: Payer: Self-pay | Admitting: Family Medicine

## 2020-01-28 ENCOUNTER — Ambulatory Visit: Payer: BC Managed Care – PPO | Admitting: Family Medicine

## 2020-01-28 ENCOUNTER — Other Ambulatory Visit: Payer: Self-pay

## 2020-01-28 VITALS — BP 106/68 | HR 84 | Temp 97.7°F | Resp 16 | Ht 62.0 in | Wt 164.0 lb

## 2020-01-28 DIAGNOSIS — R1013 Epigastric pain: Secondary | ICD-10-CM | POA: Diagnosis not present

## 2020-01-28 DIAGNOSIS — K5904 Chronic idiopathic constipation: Secondary | ICD-10-CM | POA: Diagnosis not present

## 2020-01-28 MED ORDER — TRULANCE 3 MG PO TABS: 1.0000 | ORAL_TABLET | Freq: Every day | ORAL | 1 refills | Status: DC

## 2020-01-28 MED ORDER — OMEPRAZOLE 40 MG PO CPDR: 40.0000 mg | DELAYED_RELEASE_CAPSULE | Freq: Two times a day (BID) | ORAL | 0 refills | Status: DC

## 2020-01-28 NOTE — Progress Notes (Signed)
Acute Office Visit  Subjective:    Patient ID: Judy George, female    DOB: 12/21/1980, 39 y.o.   MRN: [id number]  Chief Complaint  Patient presents with  . Abdominal Pain    HPI Patient is in today for Abdominal pain x 3 weeks in epigastric area. Nauseated, but not vomiting. Denies reflux. No bladder symptoms. Linzess helps with her constipation, but not enough. Uses miralax once a day. Had a small BM today. No bm prior to that for 1 week. EGD done 1 year ago showed gastritis, neg h. Pylori. Given prilosec at the time, but is not currently taking it.  Colonoscopy  1 yr ago-  normal. CT abdomen 10/2019 showed area in stomach concerning for gastric ulcer or gastritis.   Chronic idiopathic constipation - Dr. Chales Abrahams had tried trulance, but cost too much although worked the best. Salome Holmes, Senna, dulcolax, fiber supplements, and Miralax none of which have helped. Her constipation started following the birth of her first child (she has 4 children.)     Past Medical History:  Diagnosis Date  . Alpha+ thalassemia   . Anemia   . Chronic constipation 06/05/2016  . Diastasis recti 11/30/2015  . History of colon polyps   . History of irregular heartbeat 06/05/2016  . Kidney stones     Past Surgical History:  Procedure Laterality Date  . CESAREAN SECTION     X3  . COLONOSCOPY  2016   Dr Judie Petit in [place] 1 removed and it was non cancerous   . HERNIA REPAIR  04/2013  . TUBAL LIGATION      Family History  Problem Relation Age of Onset  . Hypertension Mother   . Colon polyps Mother   . Diabetes Maternal Aunt   . Heart attack Maternal Aunt   . Colon cancer Neg Hx   . Esophageal cancer Neg Hx     Social History   Socioeconomic History  . Marital status: Married    Spouse name: Not on file  . Number of children: 2  . Years of education: Not on file  . Highest education level: Not on file  Occupational History  . Occupation: Dentist  Tobacco Use  . Smoking  status: Never Smoker  . Smokeless tobacco: Never Used  Vaping Use  . Vaping Use: Never used  Substance and Sexual Activity  . Alcohol use: Never  . Drug use: Never  . Sexual activity: Yes    Partners: Male    Birth control/protection: Surgical  Other Topics Concern  . Not on file  Social History Narrative  . Not on file   Social Determinants of Health   Financial Resource Strain:   . Difficulty of Paying Living Expenses: Not on file  Food Insecurity:   . Worried About Programme researcher, broadcasting/film/video in the Last Year: Not on file  . Ran Out of Food in the Last Year: Not on file  Transportation Needs:   . Lack of Transportation (Medical): Not on file  . Lack of Transportation (Non-Medical): Not on file  Physical Activity:   . Days of Exercise per Week: Not on file  . Minutes of Exercise per Session: Not on file  Stress:   . Feeling of Stress : Not on file  Social Connections:   . Frequency of Communication with Friends and Family: Not on file  . Frequency of Social Gatherings with Friends and Family: Not on file  . Attends Religious Services: Not on file  .  Active Member of Clubs or Organizations: Not on file  . Attends Banker Meetings: Not on file  . Marital Status: Not on file  Intimate Partner Violence:   . Fear of Current or Ex-Partner: Not on file  . Emotionally Abused: Not on file  . Physically Abused: Not on file  . Sexually Abused: Not on file    Outpatient Medications Prior to Visit  Medication Sig Dispense Refill  . linaclotide (LINZESS) 290 MCG CAPS capsule Take 1 capsule (290 mcg total) by mouth daily. 90 capsule 1  . amoxicillin (AMOXIL) 875 MG tablet Take 1 tablet (875 mg total) by mouth 2 (two) times daily. 20 tablet 0   No facility-administered medications prior to visit.    No Known Allergies  Review of Systems  Constitutional: Positive for fatigue. Negative for chills and fever.  HENT: Negative for congestion, rhinorrhea and sore throat.     Respiratory: Negative for cough and shortness of breath.   Cardiovascular: Negative for chest pain and palpitations.  Gastrointestinal: Positive for abdominal pain, constipation and nausea. Negative for blood in stool, diarrhea and vomiting.  Genitourinary: Negative for dysuria and urgency.  Musculoskeletal: Negative for back pain and myalgias.  Neurological: Negative for dizziness, weakness, light-headedness and headaches.  Psychiatric/Behavioral: Negative for dysphoric mood. The patient is not nervous/anxious.        Objective:    Physical Exam Vitals reviewed.  Constitutional:      Appearance: Normal appearance.  Cardiovascular:     Rate and Rhythm: Normal rate and regular rhythm.  Pulmonary:     Effort: Pulmonary effort is normal.     Breath sounds: Normal breath sounds.  Abdominal:     General: Bowel sounds are normal.     Tenderness: There is abdominal tenderness in the epigastric area.  Musculoskeletal:        General: Normal range of motion.     Cervical back: Normal range of motion.  Skin:    General: Skin is warm.  Neurological:     Mental Status: She is alert.  Psychiatric:        Mood and Affect: Mood normal.        Behavior: Behavior normal.     BP 106/68   Pulse 84   Temp 97.7 F (36.5 C)   Resp 16   Ht 5\' 2"  (1.575 m)   Wt 164 lb (74.4 kg)   BMI 30.00 kg/m  Wt Readings from Last 3 Encounters:  01/28/20 164 lb (74.4 kg)  12/07/19 162 lb (73.5 kg)  10/29/19 161 lb (73 kg)    Health Maintenance Due  Topic Date Due  . Hepatitis C Screening  Never done  . COVID-19 Vaccine (1) Never done  . HIV Screening  Never done  . INFLUENZA VACCINE  10/18/2019  . MAMMOGRAM  12/23/2019    There are no preventive care reminders to display for this patient.   Lab Results  Component Value Date   TSH 0.968 10/23/2019   Lab Results  Component Value Date   WBC 4.7 10/23/2019   HGB 10.7 (L) 10/23/2019   HCT 34.7 10/23/2019   MCV 72 (L) 10/23/2019   PLT  308 10/23/2019   Lab Results  Component Value Date   NA 140 10/23/2019   K 4.8 10/23/2019   CO2 24 10/23/2019   GLUCOSE 88 10/23/2019   BUN 7 10/23/2019   CREATININE 0.68 10/23/2019   BILITOT 0.3 10/23/2019   ALKPHOS 72 10/23/2019   AST  18 10/23/2019   ALT 9 10/23/2019   PROT 7.2 10/23/2019   ALBUMIN 4.3 10/23/2019   CALCIUM 9.3 10/23/2019   Lab Results  Component Value Date   CHOL 193 10/29/2019   Lab Results  Component Value Date   HDL 75 10/29/2019   Lab Results  Component Value Date   LDLCALC 109 (H) 10/29/2019   Lab Results  Component Value Date   TRIG 44 10/29/2019   Lab Results  Component Value Date   CHOLHDL 2.6 10/29/2019   No results found for: HGBA1C     Assessment & Plan:  1. Dyspepsia - ddx: acute vs chronic gastritis without hemorrhage  Start on omeprazole 40 mg twice daily.  2. Chronic idiopathic constipation Recommend magnesium citrate 1/2 bottle and if no BM in 2 hours, take the other half.  Continue Linzess for now until I can get trulance approved through insurance.  May continue miralax if needed.  See HPI to help with PA of trulance.   My nursing staff have aided in the documentation of this note on the behalf of Blane Ohara, MD,as directed by  Blane Ohara, MD and thoroughly reviewed by Blane Ohara, MD.  I spent 20 minutes dedicated to the care of this patient on the date of this encounter to include face-to-face time with the patient, as well as preparing to see the patient (review EGD, Colonoscopy, CT abdomen/pelvis, GI notes), obtaining and/or reviewing separately obtained history, performing a medically appropriate examination and/or evaluation, Ordering medications, documenting clinical information in the electronic or other health record, and Independently interpreting results (CT abdomen/pelvis) and communicating results to the patient.  Follow-up: Return in about 4 weeks (around 02/25/2020) for abdominal pain/constipation.  An After  Visit Summary was printed and given to the patient.  Blane Ohara, MD [facility] [phone]

## 2020-01-28 NOTE — Patient Instructions (Addendum)
Constipation: Recommend magnesium citrate 1/2 bottle and if no BM in 2 hours, take the other half.  Continue Linzess for now until I can get trulance approved through insurance.  May continue miralax if needed.   Acute gastritis: Start on omeprazole 40 mg twice daily.    Gastritis, Adult  Gastritis is swelling (inflammation) of the stomach. Gastritis can develop quickly (acute). It can also develop slowly over time (chronic). It is important to get help for this condition. If you do not get help, your stomach can bleed, and you can get sores (ulcers) in your stomach. What are the causes? This condition may be caused by:  Germs that get to your stomach.  Drinking too much alcohol.  Medicines you are taking.  Too much acid in the stomach.  A disease of the intestines or stomach.  Stress.  An allergic reaction.  Crohn's disease.  Some cancer treatments (radiation). Sometimes the cause of this condition is not known. What are the signs or symptoms? Symptoms of this condition include:  Pain in your stomach.  A burning feeling in your stomach.  Feeling sick to your stomach (nauseous).  Throwing up (vomiting).  Feeling too full after you eat.  Weight loss.  Bad breath.  Throwing up blood.  Blood in your poop (stool). How is this diagnosed? This condition may be diagnosed with:  Your medical history and symptoms.  A physical exam.  Tests. These can include: ? Blood tests. ? Stool tests. ? A procedure to look inside your stomach (upper endoscopy). ? A test in which a sample of tissue is taken for testing (biopsy). How is this treated? Treatment for this condition depends on what caused it. You may be given:  Antibiotic medicine, if your condition was caused by germs.  H2 blockers and similar medicines, if your condition was caused by too much acid. Follow these instructions at home: Medicines  Take over-the-counter and prescription medicines only as  told by your doctor.  If you were prescribed an antibiotic medicine, take it as told by your doctor. Do not stop taking it even if you start to feel better. Eating and drinking   Eat small meals often, instead of large meals.  Avoid foods and drinks that make your symptoms worse.  Drink enough fluid to keep your pee (urine) pale yellow. Alcohol use  Do not drink alcohol if: ? Your doctor tells you not to drink. ? You are pregnant, may be pregnant, or are planning to become pregnant.  If you drink alcohol: ? Limit your use to:  0-1 drink a day for women.  0-2 drinks a day for men. ? Be aware of how much alcohol is in your drink. In the [place] one drink equals one 12 oz bottle of beer (355 mL), one 5 oz glass of wine (148 mL), or one 1 oz glass of hard liquor (44 mL). General instructions  Talk with your doctor about ways to manage stress. You can exercise or do deep breathing, meditation, or yoga.  Do not smoke or use products that have nicotine or tobacco. If you need help quitting, ask your doctor.  Keep all follow-up visits as told by your doctor. This is important. Contact a doctor if:  Your symptoms get worse.  Your symptoms go away and then come back. Get help right away if:  You throw up blood or something that looks like coffee grounds.  You have black or dark red poop.  You throw up any time  you try to drink fluids.  Your stomach pain gets worse.  You have a fever.  You do not feel better after one week. Summary  Gastritis is swelling (inflammation) of the stomach.  You must get help for this condition. If you do not get help, your stomach can bleed, and you can get sores (ulcers).  This condition is diagnosed with medical history, physical exam, or tests.  You can be treated with medicines for germs or medicines to block too much acid in your stomach. This information is not intended to replace advice given to you by your health care provider. Make  sure you discuss any questions you have with your health care provider. Document Revised: 07/23/2017 Document Reviewed: 07/23/2017 Elsevier Patient Education  2020 [employer].

## 2020-01-28 NOTE — Telephone Encounter (Signed)
ok 

## 2020-01-29 NOTE — Telephone Encounter (Signed)
They will be fine with me.

## 2020-02-05 ENCOUNTER — Ambulatory Visit: Payer: BC Managed Care – PPO | Admitting: Gastroenterology

## 2020-02-05 ENCOUNTER — Encounter: Payer: Self-pay | Admitting: Gastroenterology

## 2020-02-05 VITALS — BP 114/78 | HR 75 | Ht 62.0 in | Wt 164.4 lb

## 2020-02-05 DIAGNOSIS — K581 Irritable bowel syndrome with constipation: Secondary | ICD-10-CM

## 2020-02-05 DIAGNOSIS — K5909 Other constipation: Secondary | ICD-10-CM | POA: Diagnosis not present

## 2020-02-05 NOTE — Patient Instructions (Signed)
Decrease Omeprazole to 40mg  once a day.  Increase water intake  Take 17g of Miralax daily  Take Trulance daily  Please follow up in 6 months

## 2020-02-05 NOTE — Progress Notes (Signed)
Chief Complaint: FU  Referring Provider:  Blane Ohara, MD      ASSESSMENT AND PLAN;   #1. IBS-C. Neg colon 01/2019. Nl TSH 05/2016 #2. IDA with neg EGD with SB bx, neg colon 01/2019, neg CT AP 10/2019. (Thal trait in past per Dr Melvyn Neth) #3. GERD with [facility].  Neg cardiac work-up. #4. FH colon polyps (mom < 50)   Plan: - Decrease omeprazole 40mg  po qd. In 12 weeks, can stop. - Miralax 17g po qd - Trulance 3mg  po qd. she has medications. - Increase water intake - Walking 30 min/day. - She will call in 2 weeks to let know how she is doing.  If she still has problems, will give her a trial of Motegrity.   - FU in 6 months. - If still with problems, consider anorectal manometry/Sitzmarks study.    HPI:    Judy George is a 39 y.o. female  Judy George, mom is a patient of ours  Doing somewhat better on MiraLAX and Trulance currently.  Denies having any nausea, vomiting, heartburn, regurgitation odynophagia or dysphagia.  No weight loss or loss of appetite.  No melena or hematochezia.  She had some abdominal discomfort around umbilicus related to previous ventral hernia repair.  From previous notes Longstanding history of the patient ever since first pregnancy with daughter 15 years ago.  She does pass pellet-like stools, associated lower abdominal cramps which gets better with defecation, abdominal bloating.  If she does not take any laxatives, she would have 1 bowel movement in 1 to 2 weeks.  She does try to drink plenty of water.  Has tried multiple medications including MiraLAX which did not work very well.  Linzess 290 works off and on.  Has tried herbal tea as well.  Denies nonsteroidal use.  Denies intake of calcium    Past GI procedures: Colonoscopy 01/23/2019  -Small internal hemorrhoids. -Otherwise normal colonoscopy to TI. The colon was highly redundant. -Repeat in 10 years.  Earlier, if with any new problems EGD 01/23/2019  -Minimal  gastritis -Otherwise normal EGD. -Neg SB bx and gastric biopsies  CT Abdo/pelvis with p.o. and IV contrast 10/2019 -Antral thickening -No acute abnormalities -S/P ventral hernia repair.  No recurrent hernias.  -had colonoscopy performed by Dr. 13/08/2018 ?  2015/2016-had one colonic polyp removed.  Mom also had polyps. Past Medical History:  Diagnosis Date  . Alpha+ thalassemia (HCC)   . Anemia   . Chronic constipation 06/05/2016  . Diastasis recti 11/30/2015  . History of colon polyps   . History of irregular heartbeat 06/05/2016  . Kidney stones     Past Surgical History:  Procedure Laterality Date  . CESAREAN SECTION     X3  . COLONOSCOPY  2016   Dr 06/07/2016 in [place] 1 removed and it was non cancerous   . HERNIA REPAIR  04/2013  . TUBAL LIGATION      Family History  Problem Relation Age of Onset  . Hypertension Mother   . Colon polyps Mother   . Diabetes Maternal Aunt   . Heart attack Maternal Aunt   . Colon cancer Neg Hx   . Esophageal cancer Neg Hx     Social History   Tobacco Use  . Smoking status: Never Smoker  . Smokeless tobacco: Never Used  Vaping Use  . Vaping Use: Never used  Substance Use Topics  . Alcohol use: Never  . Drug use: Never    Current Outpatient Medications  Medication Sig Dispense Refill  . omeprazole (PRILOSEC) 40 MG capsule Take 1 capsule (40 mg total) by mouth 2 (two) times daily. 60 capsule 0  . Plecanatide (TRULANCE) 3 MG TABS Take 1 tablet by mouth daily. 90 tablet 1   No current facility-administered medications for this visit.    No Known Allergies  Review of Systems:  neg     Physical Exam:    BP 114/78   Pulse 75   Ht 5\' 2"  (1.575 m)   Wt 164 lb 6 oz (74.6 kg)   BMI 30.06 kg/m  Filed Weights   02/05/20 0820  Weight: 164 lb 6 oz (74.6 kg)   Constitutional:  Well-developed, in no acute distress. Psychiatric: Normal mood and affect. Behavior is normal. HEENT: Pupils normal.  Conjunctivae are normal. No  scleral icterus. Neck supple.  Cardiovascular: Normal rate, regular rhythm. No edema Pulmonary/chest: Effort normal and breath sounds normal. No wheezing, rales or rhonchi. Abdominal: Soft, nondistended. Nontender. Bowel sounds active throughout. There are no masses palpable. No hepatomegaly.  Rectal diastases.  No hernias. Rectal:  defered Neurological: Alert and oriented to person place and time. Skin: Skin is warm and dry. No rashes noted.  Data Reviewed: I have personally reviewed following labs and imaging studies Labs reviewed from 05/2017 hemoglobin 11.8, MCV 72.  Labs 05/2016 hemoglobin 11.2, MCV 70.  Normal CMP otherwise.  Normal TSH 05/2016   06/2016, MD 02/05/2020, 8:39 AM  Cc: 02/07/2020, MD

## 2020-02-29 ENCOUNTER — Ambulatory Visit: Payer: BC Managed Care – PPO | Admitting: Family Medicine

## 2020-03-08 ENCOUNTER — Encounter: Payer: Self-pay | Admitting: Family Medicine

## 2020-03-22 DIAGNOSIS — Z86010 Personal history of colonic polyps: Secondary | ICD-10-CM | POA: Insufficient documentation

## 2020-03-22 DIAGNOSIS — D560 Alpha thalassemia: Secondary | ICD-10-CM | POA: Insufficient documentation

## 2020-03-23 ENCOUNTER — Ambulatory Visit (INDEPENDENT_AMBULATORY_CARE_PROVIDER_SITE_OTHER): Payer: BC Managed Care – PPO

## 2020-03-23 ENCOUNTER — Other Ambulatory Visit: Payer: Self-pay | Admitting: Cardiology

## 2020-03-23 ENCOUNTER — Encounter: Payer: Self-pay | Admitting: Cardiology

## 2020-03-23 ENCOUNTER — Ambulatory Visit: Payer: BC Managed Care – PPO | Admitting: Cardiology

## 2020-03-23 ENCOUNTER — Other Ambulatory Visit: Payer: Self-pay

## 2020-03-23 VITALS — BP 122/70 | HR 89 | Ht 62.0 in | Wt 167.0 lb

## 2020-03-23 DIAGNOSIS — R0602 Shortness of breath: Secondary | ICD-10-CM | POA: Diagnosis not present

## 2020-03-23 DIAGNOSIS — R0789 Other chest pain: Secondary | ICD-10-CM

## 2020-03-23 DIAGNOSIS — R002 Palpitations: Secondary | ICD-10-CM

## 2020-03-23 DIAGNOSIS — R011 Cardiac murmur, unspecified: Secondary | ICD-10-CM | POA: Diagnosis not present

## 2020-03-23 NOTE — Progress Notes (Signed)
EKG

## 2020-03-23 NOTE — Progress Notes (Signed)
Cardiology Office Note:    Date:  03/23/2020   ID:  Judy George, Judy George January 17, 1981, MRN [id number]  PCP:  Blane Ohara, MD  Cardiologist:  Thomasene Ripple, DO  Electrophysiologist:  None   Referring MD: Blane Ohara, MD   " I have been experiencing shortness of breath and palpitations"  History of Present Illness:    Judy George is a 40 y.o. female with a hx of irregular heartbeats is here today to be evaluated for palpitations, worsening shortness of breath and chest discomfort.  The patient was seen by Dr. Jeannetta Nap at that time she underwent a stress echocardiogram which was reported to be normal.  She notes that her symptoms may have subsided but not completely resolved.  She tells me over the last several months she has had worsening shortness of breath on exertion along with increasing palpitations.  Along with the palpitation she does have some chest comfort.  She described as abrupt onset of fast heartbeat which last for minutes at a time makes her sometimes very winded before resolving.  She is concerned given her family history as her mother was just recently diagnosed with atrial fibrillation and other close aunts and uncle with premature coronary artery disease.  Past Medical History:  Diagnosis Date  . Alpha+ thalassemia (HCC)   . Anemia   . Chronic constipation 06/05/2016  . Diastasis recti 11/30/2015  . History of colon polyps   . History of irregular heartbeat 06/05/2016  . Kidney stones     Past Surgical History:  Procedure Laterality Date  . CESAREAN SECTION     X3  . COLONOSCOPY  2016   Dr Judie Petit in [place] 1 removed and it was non cancerous   . HERNIA REPAIR  04/2013  . TUBAL LIGATION      Current Medications: Current Meds  Medication Sig  . Plecanatide (TRULANCE) 3 MG TABS Take 1 tablet by mouth daily.     Allergies:   Patient has no known allergies.   Social History   Socioeconomic History  . Marital status: Married    Spouse name: Not  on file  . Number of children: 2  . Years of education: Not on file  . Highest education level: Not on file  Occupational History  . Occupation: Dentist  Tobacco Use  . Smoking status: Never Smoker  . Smokeless tobacco: Never Used  Vaping Use  . Vaping Use: Never used  Substance and Sexual Activity  . Alcohol use: Never  . Drug use: Never  . Sexual activity: Yes    Partners: Male    Birth control/protection: Surgical  Other Topics Concern  . Not on file  Social History Narrative  . Not on file   Social Determinants of Health   Financial Resource Strain: Not on file  Food Insecurity: Not on file  Transportation Needs: Not on file  Physical Activity: Not on file  Stress: Not on file  Social Connections: Not on file     Family History: The patient's family history includes Colon polyps in her mother; Diabetes in her maternal aunt; Heart attack in her maternal aunt; Hypertension in her mother. There is no history of Colon cancer or Esophageal cancer.  ROS:   Review of Systems  Constitution: Negative for decreased appetite, fever and weight gain.  HENT: Negative for congestion, ear discharge, hoarse voice and sore throat.   Eyes: Negative for discharge, redness, vision loss in right eye and visual halos.  Cardiovascular: Reported  chest discomfort, palpitation shortness of breath and dyspnea on exertion.  Negative for leg swelling, orthopnea. Respiratory: Negative for cough, hemoptysis, shortness of breath and snoring.   Endocrine: Negative for heat intolerance and polyphagia.  Hematologic/Lymphatic: Negative for bleeding problem. Does not bruise/bleed easily.  Skin: Negative for flushing, nail changes, rash and suspicious lesions.  Musculoskeletal: Negative for arthritis, joint pain, muscle cramps, myalgias, neck pain and stiffness.  Gastrointestinal: Negative for abdominal pain, bowel incontinence, diarrhea and excessive appetite.  Genitourinary: Negative for  decreased libido, genital sores and incomplete emptying.  Neurological: Negative for brief paralysis, focal weakness, headaches and loss of balance.  Psychiatric/Behavioral: Negative for altered mental status, depression and suicidal ideas.  Allergic/Immunologic: Negative for HIV exposure and persistent infections.    EKGs/Labs/Other Studies Reviewed:    The following studies were reviewed today:   EKG:  The ekg ordered today demonstrates sinus rhythm, heart rate 89 bpm with poor R wave progression in the precordial leads suggesting possible anterior infarction of age-indeterminate.  Transthoracic echocardiogram Study Conclusions   - Left ventricle: The cavity size was normal. Systolic function was  normal. The estimated ejection fraction was in the range of 55%  to 60%. Wall motion was normal; there were no regional wall  motion abnormalities. Left ventricular diastolic function  parameters were normal.   Recent Labs: 10/23/2019: ALT 9; BUN 7; Creatinine, Ser 0.68; Hemoglobin 10.7; Platelets 308; Potassium 4.8; Sodium 140; TSH 0.968  Recent Lipid Panel    Component Value Date/Time   CHOL 193 10/29/2019 1202   TRIG 44 10/29/2019 1202   HDL 75 10/29/2019 1202   CHOLHDL 2.6 10/29/2019 1202   LDLCALC 109 (H) 10/29/2019 1202    Physical Exam:    VS:  BP 122/70   Pulse 89   Ht 5\' 2"  (1.575 m)   Wt 167 lb (75.8 kg)   SpO2 98%   BMI 30.54 kg/m     Wt Readings from Last 3 Encounters:  03/23/20 167 lb (75.8 kg)  02/05/20 164 lb 6 oz (74.6 kg)  01/28/20 164 lb (74.4 kg)     GEN: Well nourished, well developed in no acute distress HEENT: Normal NECK: No JVD; No carotid bruits LYMPHATICS: No lymphadenopathy CARDIAC: S1S2 noted,RRR, 4/6 holosystolic murmurs, rubs, gallops RESPIRATORY:  Clear to auscultation without rales, wheezing or rhonchi  ABDOMEN: Soft, non-tender, non-distended, +bowel sounds, no guarding. EXTREMITIES: No edema, No cyanosis, no  clubbing MUSCULOSKELETAL:  No deformity  SKIN: Warm and dry NEUROLOGIC:  Alert and oriented x 3, non-focal PSYCHIATRIC:  Normal affect, good insight  ASSESSMENT:    1. Shortness of breath   2. Murmur   3. Palpitations   4. Chest discomfort    PLAN:     1.  Her echo since her last evaluation seems to be worse it was described as 2/6 today appears to be 4/6 with her persistent worsening shortness of breath I like to repeat an echocardiogram to make sure that she is not develop any valvular abnormalities.  In addition I would like to rule out a cardiovascular etiology of this palpitation, therefore at this time I would like to placed a zio patch for 14 days.   Once these testing have been performed amd reviewed further reccomendations will be made. For now, I do reccomend that the patient goes to the nearest ED if  symptoms recur.  She is got some chest discomfort along with her EKG which is not quite normal.  I will first get her echo  and assess for valvular abnormalities before recommending any potential ischemic evaluation.  She recently had a stress echo 2 years ago which was reported to be normal.  If needed a coronary CTA would be the best way to evaluate this patient.  The patient is in agreement with the above plan. The patient left the office in stable condition.  The patient will follow up in 3 months or sooner if needed.   Medication Adjustments/Labs and Tests Ordered: Current medicines are reviewed at length with the patient today.  Concerns regarding medicines are outlined above.  Orders Placed This Encounter  Procedures  . LONG TERM MONITOR (3-14 DAYS)  . EKG 12-Lead  . ECHOCARDIOGRAM COMPLETE   No orders of the defined types were placed in this encounter.   Patient Instructions  Medication Instructions:  Your physician recommends that you continue on your current medications as directed. Please refer to the Current Medication list given to you today.  *If you need  a refill on your cardiac medications before your next appointment, please call your pharmacy*   Testing/Procedures: Your physician has requested that you have an echocardiogram. Echocardiography is a painless test that uses sound waves to create images of your heart. It provides your doctor with information about the size and shape of your heart and how well your heart's chambers and valves are working. This procedure takes approximately one hour. There are no restrictions for this procedure.  Your physician has recommended that you wear an event monitor. Event monitors are medical devices that record the heart's electrical activity. Doctors most often [place] these monitors to diagnose arrhythmias. Arrhythmias are problems with the speed or rhythm of the heartbeat. The monitor is a small, portable device. You can wear one while you do your normal daily activities. This is usually used to diagnose what is causing palpitations/syncope (passing out).  Follow-Up: At [facility], you and your health needs are our priority.  As part of our continuing mission to provide you with exceptional heart care, we have created designated Provider Care Teams.  These Care Teams include your primary Cardiologist (physician) and Advanced Practice Providers (APPs -  Physician Assistants and Nurse Practitioners) who all work together to provide you with the care you need, when you need it.  Your next appointment:   3 month(s)  The format for your next appointment:   In Person  Provider:   Thomasene Ripple, DO      Adopting a Healthy Lifestyle.  Know what a healthy weight is for you (roughly BMI <25) and aim to maintain this   Aim for 7+ servings of fruits and vegetables daily   65-80+ fluid ounces of water or unsweet tea for healthy kidneys   Limit to max 1 drink of alcohol per day; avoid smoking/tobacco   Limit animal fats in diet for cholesterol and heart health - choose grass fed whenever available   Avoid  highly processed foods, and foods high in saturated/trans fats   Aim for low stress - take time to unwind and care for your mental health   Aim for 150 min of moderate intensity exercise weekly for heart health, and weights twice weekly for bone health   Aim for 7-9 hours of sleep daily   When it comes to diets, agreement about the perfect plan isnt easy to find, even among the experts. Experts at the [facility] [employer] developed an idea known as the Healthy Eating Plate. Just imagine a plate divided into logical,  healthy portions.   The emphasis is on diet quality:   Load up on vegetables and fruits - one-half of your plate: Aim for color and variety, and remember that potatoes dont count.   Go for whole grains - one-quarter of your plate: Whole wheat, barley, wheat berries, quinoa, oats, brown rice, and foods made with them. If you want pasta, go with whole wheat pasta.   Protein power - one-quarter of your plate: Fish, chicken, beans, and nuts are all healthy, versatile protein sources. Limit red meat.   The diet, however, does go beyond the plate, offering a few other suggestions.   Use healthy plant oils, such as olive, canola, soy, corn, sunflower and peanut. Check the labels, and avoid partially hydrogenated oil, which have unhealthy trans fats.   If youre thirsty, drink water. Coffee and tea are good in moderation, but skip sugary drinks and limit milk and dairy products to one or two daily servings.   The type of carbohydrate in the diet is more important than the amount. Some sources of carbohydrates, such as vegetables, fruits, whole grains, and beans-are healthier than others.   Finally, stay active  Signed, Thomasene Ripple, DO  03/23/2020 4:02 PM    [facility]

## 2020-03-23 NOTE — Patient Instructions (Signed)
Medication Instructions:  Your physician recommends that you continue on your current medications as directed. Please refer to the Current Medication list given to you today.  *If you need a refill on your cardiac medications before your next appointment, please call your pharmacy*   Testing/Procedures: Your physician has requested that you have an echocardiogram. Echocardiography is a painless test that uses sound waves to create images of your heart. It provides your doctor with information about the size and shape of your heart and how well your heart's chambers and valves are working. This procedure takes approximately one hour. There are no restrictions for this procedure.  Your physician has recommended that you wear an event monitor. Event monitors are medical devices that record the heart's electrical activity. Doctors most often [place] these monitors to diagnose arrhythmias. Arrhythmias are problems with the speed or rhythm of the heartbeat. The monitor is a small, portable device. You can wear one while you do your normal daily activities. This is usually used to diagnose what is causing palpitations/syncope (passing out).  Follow-Up: At [facility], you and your health needs are our priority.  As part of our continuing mission to provide you with exceptional heart care, we have created designated Provider Care Teams.  These Care Teams include your primary Cardiologist (physician) and Advanced Practice Providers (APPs -  Physician Assistants and Nurse Practitioners) who all work together to provide you with the care you need, when you need it.  Your next appointment:   3 month(s)  The format for your next appointment:   In Person  Provider:   Thomasene Ripple, DO

## 2020-04-19 ENCOUNTER — Encounter: Payer: Self-pay | Admitting: Family Medicine

## 2020-04-19 ENCOUNTER — Ambulatory Visit: Payer: BC Managed Care – PPO | Admitting: Family Medicine

## 2020-04-19 ENCOUNTER — Other Ambulatory Visit: Payer: Self-pay

## 2020-04-19 VITALS — BP 118/78 | HR 82 | Temp 97.8°F | Ht 62.0 in | Wt 164.0 lb

## 2020-04-19 DIAGNOSIS — Z6830 Body mass index (BMI) 30.0-30.9, adult: Secondary | ICD-10-CM

## 2020-04-19 DIAGNOSIS — R002 Palpitations: Secondary | ICD-10-CM

## 2020-04-19 DIAGNOSIS — K5904 Chronic idiopathic constipation: Secondary | ICD-10-CM

## 2020-04-19 DIAGNOSIS — E6609 Other obesity due to excess calories: Secondary | ICD-10-CM

## 2020-04-19 NOTE — Patient Instructions (Addendum)
Weight loss options: JQGBEE.once weekly (injectible) Saxenda once daily. (injectible) Contrave.   [facility].

## 2020-04-19 NOTE — Progress Notes (Signed)
Subjective:  Patient ID: Judy George, female    DOB: September 14, 1980  Age: 40 y.o. MRN: [id number]  Chief Complaint  Patient presents with  . Discuss Wt loss    HPI  Overweight:  Pt has been Reducing calories. Skipping breakfast. Lunch is at 10:25 (healthy choice bowls.) and has fruit cups or apple sauce for snacks. Using smaller plates for supper. Walking 30 minutes per day on the weekend. Increased water intake. In the past has used phentermine and would like to try this again. Chronic idiopathic constipation: Trulance and miralax helps.  Palpitations: Pt was seen by Dr. Servando Salina. Holter monitor worn and turned in and she has echo scheduled tomorrow.   Current Outpatient Medications on File Prior to Visit  Medication Sig Dispense Refill  . Plecanatide (TRULANCE) 3 MG TABS Take 1 tablet by mouth daily. 90 tablet 1   No current facility-administered medications on file prior to visit.   Past Medical History:  Diagnosis Date  . Alpha+ thalassemia (HCC)   . Anemia   . Chronic constipation 06/05/2016  . Diastasis recti 11/30/2015  . History of colon polyps   . History of irregular heartbeat 06/05/2016  . Kidney stones    Past Surgical History:  Procedure Laterality Date  . CESAREAN SECTION     X3  . COLONOSCOPY  2016   Dr Judie Petit in [place] 1 removed and it was non cancerous   . HERNIA REPAIR  04/2013  . TUBAL LIGATION      Family History  Problem Relation Age of Onset  . Hypertension Mother   . Colon polyps Mother   . Diabetes Maternal Aunt   . Heart attack Maternal Aunt   . Colon cancer Neg Hx   . Esophageal cancer Neg Hx    Social History   Socioeconomic History  . Marital status: Married    Spouse name: Not on file  . Number of children: 2  . Years of education: Not on file  . Highest education level: Not on file  Occupational History  . Occupation: Dentist  Tobacco Use  . Smoking status: Never Smoker  . Smokeless tobacco: Never Used  Vaping Use   . Vaping Use: Never used  Substance and Sexual Activity  . Alcohol use: Never  . Drug use: Never  . Sexual activity: Yes    Partners: Male    Birth control/protection: Surgical  Other Topics Concern  . Not on file  Social History Narrative  . Not on file   Social Determinants of Health   Financial Resource Strain: Not on file  Food Insecurity: Not on file  Transportation Needs: Not on file  Physical Activity: Not on file  Stress: Not on file  Social Connections: Not on file    Review of Systems  Constitutional: Negative for fatigue and fever.  HENT: Negative for congestion, ear pain, sinus pressure and sore throat.   Eyes: Negative for pain.  Respiratory: Negative for cough, chest tightness, shortness of breath and wheezing.   Cardiovascular: Negative for chest pain and palpitations.  Gastrointestinal: Negative for abdominal pain, constipation, diarrhea, nausea and vomiting.  Genitourinary: Negative for dysuria and hematuria.  Musculoskeletal: Negative for arthralgias, back pain, joint swelling and myalgias.  Skin: Negative for rash.  Neurological: Negative for dizziness, weakness and headaches.  Psychiatric/Behavioral: Negative for dysphoric mood. The patient is not nervous/anxious.      Objective:  BP 118/78 (BP Location: Left Arm, Patient Position: Sitting)   Pulse 82  Temp 97.8 F (36.6 C) (Temporal)   Ht 5\' 2"  (1.575 m)   Wt 164 lb (74.4 kg)   SpO2 95%   BMI 30.00 kg/m   BP/Weight 04/19/2020 03/23/2020 02/05/2020  Systolic BP 118 122 114  Diastolic BP 78 70 78  Wt. (Lbs) 164 167 164.38  BMI 30 30.54 30.06    Physical Exam Vitals reviewed.  Constitutional:      Appearance: Normal appearance. She is obese.  Cardiovascular:     Rate and Rhythm: Normal rate and regular rhythm.     Heart sounds: Normal heart sounds.  Pulmonary:     Effort: Pulmonary effort is normal.     Breath sounds: Normal breath sounds.  Neurological:     Mental Status: She is  alert.  Psychiatric:        Mood and Affect: Mood normal.        Behavior: Behavior normal.     Diabetic Foot Exam - Simple   No data filed      Lab Results  Component Value Date   WBC 4.7 10/23/2019   HGB 10.7 (L) 10/23/2019   HCT 34.7 10/23/2019   PLT 308 10/23/2019   GLUCOSE 88 10/23/2019   CHOL 193 10/29/2019   TRIG 44 10/29/2019   HDL 75 10/29/2019   LDLCALC 109 (H) 10/29/2019   ALT 9 10/23/2019   AST 18 10/23/2019   NA 140 10/23/2019   K 4.8 10/23/2019   CL 101 10/23/2019   CREATININE 0.68 10/23/2019   BUN 7 10/23/2019   CO2 24 10/23/2019   TSH 0.968 10/23/2019      Assessment & Plan:   1. Chronic idiopathic constipation The current medical regimen is effective;  continue present plan and medications.  2. Class 1 obesity due to excess calories without serious comorbidity with body mass index (BMI) of 30.0 to 30.9 in adult Recommend continue to work on eating healthy diet and exercise. I would not agree to adipex due to palpitations. Given ozempic 0.25 mg once weekly Consider alternatives (Wegovy, Saxenda, Contrave) Consider [facility].  3. Palpitations Await holter monitor.  No adipex for now.  Follow-up: Return in about 4 weeks (around 05/17/2020).  An After Visit Summary was printed and given to the patient.  07/17/2020, MD [facility] [phone]

## 2020-04-20 ENCOUNTER — Ambulatory Visit (INDEPENDENT_AMBULATORY_CARE_PROVIDER_SITE_OTHER): Payer: BC Managed Care – PPO

## 2020-04-20 DIAGNOSIS — R0602 Shortness of breath: Secondary | ICD-10-CM | POA: Diagnosis not present

## 2020-04-20 LAB — ECHOCARDIOGRAM COMPLETE
Area-P 1/2: 4.49 cm2
S' Lateral: 3.2 cm
Single Plane A4C EF: 60.5 %

## 2020-04-20 NOTE — Progress Notes (Signed)
Complete echocardiogram performed.  Jimmy Jansel Vonstein RDCS, RVT  

## 2020-04-27 ENCOUNTER — Ambulatory Visit: Payer: BC Managed Care – PPO | Admitting: Cardiology

## 2020-04-27 ENCOUNTER — Other Ambulatory Visit: Payer: Self-pay

## 2020-04-27 ENCOUNTER — Encounter: Payer: Self-pay | Admitting: Cardiology

## 2020-04-27 VITALS — BP 130/86 | HR 90 | Ht 62.0 in | Wt 163.4 lb

## 2020-04-27 DIAGNOSIS — R079 Chest pain, unspecified: Secondary | ICD-10-CM | POA: Diagnosis not present

## 2020-04-27 DIAGNOSIS — R0609 Other forms of dyspnea: Secondary | ICD-10-CM

## 2020-04-27 DIAGNOSIS — R0600 Dyspnea, unspecified: Secondary | ICD-10-CM

## 2020-04-27 DIAGNOSIS — I472 Ventricular tachycardia: Secondary | ICD-10-CM | POA: Diagnosis not present

## 2020-04-27 DIAGNOSIS — I4729 Other ventricular tachycardia: Secondary | ICD-10-CM

## 2020-04-27 MED ORDER — METOPROLOL TARTRATE 100 MG PO TABS: ORAL_TABLET | ORAL | 0 refills | Status: DC

## 2020-04-27 NOTE — Progress Notes (Signed)
Cardiology Office Note:    Date:  04/27/2020   ID:  Judy George, DOB 03-May-1980, MRN [id number]  PCP:  Judy George, Kirsten, MD  Cardiologist:  Thomasene RippleKardie Bran Aldridge, DO  Electrophysiologist:  None   Referring MD: Judy George, Kirsten, MD     History of Present Illness:    Judy George is a 40 y.o. female with a hx of irregular heartbeats is here today to be evaluated for palpitations, worsening shortness of breath and chest discomfort.  The patient was seen previously by Dr. Jeannetta George at that time she underwent a stress echocardiogram which was reported to be normal.  She notes that her symptoms may have subsided but not completely resolved.  I saw the patient on March 23, 2018 at that time she complained that she has been experiencing shortness of breath as well as palpitations.  Please monitor the patient and repeated an echocardiogram. She is here today to discuss her test result.  She states experiencing some dyspnea exertion  Past Medical History:  Diagnosis Date  . Alpha+ thalassemia (HCC)   . Anemia   . Chronic constipation 06/05/2016  . Diastasis recti 11/30/2015  . History of colon polyps   . History of irregular heartbeat 06/05/2016  . Kidney stones     Past Surgical History:  Procedure Laterality Date  . CESAREAN SECTION     X3  . COLONOSCOPY  2016   [place] removed and it was non cancerous   . HERNIA REPAIR  04/2013  . TUBAL LIGATION      Current Medications: Current Meds  Medication Sig  . metoprolol tartrate (LOPRESSOR) 100 MG tablet Take 2 hours prior to CT  . Plecanatide (TRULANCE) 3 MG TABS Take 1 tablet by mouth daily.     Allergies:   Patient has no known allergies.   Social History   Socioeconomic History  . Marital status: Married    Spouse name: Not on file  . Number of children: 2  . Years of education: Not on file  . Highest education level: Not on file  Occupational History  . Occupation: DentistKindergarden teacher  Tobacco Use  .  Smoking status: Never Smoker  . Smokeless tobacco: Never Used  Vaping Use  . Vaping Use: Never used  Substance and Sexual Activity  . Alcohol use: Never  . Drug use: Never  . Sexual activity: Yes    Partners: Male    Birth control/protection: Surgical  Other Topics Concern  . Not on file  Social History Narrative  . Not on file   Social Determinants of Health   Financial Resource Strain: Not on file  Food Insecurity: Not on file  Transportation Needs: Not on file  Physical Activity: Not on file  Stress: Not on file  Social Connections: Not on file     Family History: The patient's family history includes Colon polyps in her mother; Diabetes in her maternal aunt; Heart attack in her maternal aunt; Hypertension in her mother. There is no history of Colon cancer or Esophageal cancer.  ROS:   Review of Systems  Constitution: Negative for decreased appetite, fever and weight gain.  HENT: Negative for congestion, ear discharge, hoarse voice and sore throat.   Eyes: Negative for discharge, redness, vision loss in right eye and visual halos.  Cardiovascular: Negative for chest pain, dyspnea on exertion, leg swelling, orthopnea and palpitations.  Respiratory: Negative for cough, hemoptysis, shortness of breath and snoring.   Endocrine: Negative for heat  intolerance and polyphagia.  Hematologic/Lymphatic: Negative for bleeding problem. Does not bruise/bleed easily.  Skin: Negative for flushing, nail changes, rash and suspicious lesions.  Musculoskeletal: Negative for arthritis, joint pain, muscle cramps, myalgias, neck pain and stiffness.  Gastrointestinal: Negative for abdominal pain, bowel incontinence, diarrhea and excessive appetite.  Genitourinary: Negative for decreased libido, genital sores and incomplete emptying.  Neurological: Negative for brief paralysis, focal weakness, headaches and loss of balance.  Psychiatric/Behavioral: Negative for altered mental status, depression  and suicidal ideas.  Allergic/Immunologic: Negative for HIV exposure and persistent infections.    EKGs/Labs/Other Studies Reviewed:    The following studies were reviewed today:   EKG:  The ekg ordered today demonstrates    Transthoracic echo IMPRESSIONS  1. Left ventricular ejection fraction, by estimation, is 55 to 60%. The  left ventricle has normal function. The left ventricle has no regional  wall motion abnormalities. Left ventricular diastolic parameters were  normal. The average left ventricular  global longitudinal strain is -18.9 %. The global longitudinal strain is  normal.  2. Right ventricular systolic function is normal. The right ventricular  size is normal. There is normal pulmonary artery systolic pressure.  3. The mitral valve is normal in structure. Trivial mitral valve  regurgitation. No evidence of mitral stenosis.  4. The aortic valve is normal in structure. Aortic valve regurgitation is  not visualized. No aortic stenosis is present.  5. The inferior vena cava is normal in size with greater than 50%  respiratory variability, suggesting right atrial pressure of 3 mmHg.   FINDINGS  Left Ventricle: Left ventricular ejection fraction, by estimation, is 55  to 60%. The left ventricle has normal function. The left ventricle has no  regional wall motion abnormalities. The average left ventricular global  longitudinal strain is -18.9 %.  The global longitudinal strain is normal. The left ventricular internal  cavity size was normal in size. There is no left ventricular hypertrophy.  Left ventricular diastolic parameters were normal.   Right Ventricle: The right ventricular size is normal. No increase in  right ventricular wall thickness. Right ventricular systolic function is  normal. There is normal pulmonary artery systolic pressure. The tricuspid  regurgitant velocity is 2.40 m/s, and  with an assumed right atrial pressure of 3 mmHg, the estimated  right  ventricular systolic pressure is 26.0 mmHg.   Left Atrium: Left atrial size was normal in size.   Right Atrium: Right atrial size was normal in size.   Pericardium: There is no evidence of pericardial effusion.   Mitral Valve: The mitral valve is normal in structure. Trivial mitral  valve regurgitation. No evidence of mitral valve stenosis.   Tricuspid Valve: The tricuspid valve is normal in structure. Tricuspid  valve regurgitation is not demonstrated. No evidence of tricuspid  stenosis.   Aortic Valve: The aortic valve is normal in structure. Aortic valve  regurgitation is not visualized. No aortic stenosis is present.   Pulmonic Valve: The pulmonic valve was normal in structure. Pulmonic valve  regurgitation is not visualized. No evidence of pulmonic stenosis.   Aorta: The aortic root is normal in size and structure.   Venous: The inferior vena cava is normal in size with greater than 50%  respiratory variability, suggesting right atrial pressure of 3 mmHg.   IAS/Shunts: No atrial level shunt detected by color flow Doppler.    Zio monitor  The patient wore the monitor for 13  days 16  hours starting 03/23/2020   . Indication:  Palpitations  The minimum heart rate was 50  bpm, maximum heart rate was 174  bpm, and average heart rate was 84bpm. Predominant underlying rhythm was Sinus Rhythm.  2 Ventricular Tachycardia runs occurred, the run with the fastest interval lasting 9.0 seconds with a maximu rate of 169 bpm; the run with the fastest interval was also the longest.   Premature atrial complexes were rare. Premature Ventricular complexes were rare.  No pauses, No AV block, no supraventricular tachycardia and no atrial fibrillation present. 3 patient triggered events and 1 diary events were associated with premature ventricular.   Conclusion: This study is remarkable for nonsustained ventricular tachycardia.  Recent Labs: 10/23/2019: ALT 9; BUN 7;  Creatinine, Ser 0.68; Hemoglobin 10.7; Platelets 308; Potassium 4.8; Sodium 140; TSH 0.968  Recent Lipid Panel    Component Value Date/Time   CHOL 193 10/29/2019 1202   TRIG 44 10/29/2019 1202   HDL 75 10/29/2019 1202   CHOLHDL 2.6 10/29/2019 1202   LDLCALC 109 (H) 10/29/2019 1202    Physical Exam:    VS:  BP 130/86   Pulse 90   Ht 5\' 2"  (1.575 m)   Wt 163 lb 6.4 oz (74.1 kg)   SpO2 99%   BMI 29.89 kg/m     Wt Readings from Last 3 Encounters:  04/27/20 163 lb 6.4 oz (74.1 kg)  04/19/20 164 lb (74.4 kg)  03/23/20 167 lb (75.8 kg)     GEN: Well nourished, well developed in no acute distress HEENT: Normal NECK: No JVD; No carotid bruits LYMPHATICS: No lymphadenopathy CARDIAC: S1S2 noted,RRR, no murmurs, rubs, gallops RESPIRATORY:  Clear to auscultation without rales, wheezing or rhonchi  ABDOMEN: Soft, non-tender, non-distended, +bowel sounds, no guarding. EXTREMITIES: No edema, No cyanosis, no clubbing MUSCULOSKELETAL:  No deformity  SKIN: Warm and dry NEUROLOGIC:  Alert and oriented x 3, non-focal PSYCHIATRIC:  Normal affect, good insight  ASSESSMENT:    1. NSVT (nonsustained ventricular tachycardia) (HCC)   2. Chest pain, unspecified type   3. Dyspnea on exertion    PLAN:     With her dyspnea on exertion and nonsustained ventricular tachycardia I am concerned for this patient.  I will like to rule out ischemic etiology.  Shared decision a coronary CTA at this time is appropriate.  I have discussed with the patient about the testing.  The patient has no IV contrast allergy and is agreeable to proceed with this test.  Overall ischemic etiology of her NSVT as noted above.  For now we will hold off on medications.  If persists will place the patient on beta-blocker.  The patient is in agreement with the above plan. The patient left the office in stable condition.  The patient will follow up in 3 months or sooner if needed.   Medication Adjustments/Labs and Tests  Ordered: Current medicines are reviewed at length with the patient today.  Concerns regarding medicines are outlined above.  Orders Placed This Encounter  Procedures  . CT CORONARY MORPH W/CTA COR W/SCORE W/CA W/CM &/OR WO/CM  . CT CORONARY FRACTIONAL FLOW RESERVE DATA PREP  . CT CORONARY FRACTIONAL FLOW RESERVE FLUID ANALYSIS  . Basic metabolic panel  . Magnesium   Meds ordered this encounter  Medications  . metoprolol tartrate (LOPRESSOR) 100 MG tablet    Sig: Take 2 hours prior to CT    Dispense:  1 tablet    Refill:  0    Patient Instructions  Medication Instructions:  Your physician recommends that you  continue on your current medications as directed. Please refer to the Current Medication list given to you today.  *If you need a refill on your cardiac medications before your next appointment, please call your pharmacy*   Lab Work: Your physician recommends that you return for lab work: TODAY BMET, Mag If you have labs (blood work) drawn today and your tests are completely normal, you will receive your results only by: Marland Kitchen MyChart Message (if you have MyChart) OR . A paper copy in the mail If you have any lab test that is abnormal or we need to change your treatment, we will call you to review the results.   Testing/Procedures: Your cardiac CT will be scheduled at:  [facility] [address] [place], [place] [address] [phone]  If scheduled at [facility], please arrive at the [facility] main entrance (entrance A) of [facility] 30 minutes prior to test start time. Proceed to the [facility] Radiology Department (first floor) to check-in and test prep.   Please follow these instructions carefully (unless otherwise directed):  On the Night Before the Test: . Be sure to Drink plenty of water. . Do not consume any caffeinated/decaffeinated beverages or chocolate 12 hours prior to your test. . Do not take any antihistamines 12 hours  prior to your test.  On the Day of the Test: . Drink plenty of water until 1 hour prior to the test. . Do not eat any food 4 hours prior to the test. . You may take your regular medications prior to the test.  . Take metoprolol (Lopressor) two hours prior to test. . FEMALES- please wear underwire-free bra if available       After the Test: . Drink plenty of water. . After receiving IV contrast, you may experience a mild flushed feeling. This is normal. . On occasion, you may experience a mild rash up to 24 hours after the test. This is not dangerous. If this occurs, you can take Benadryl 25 mg and increase your fluid intake. . If you experience trouble breathing, this can be serious. If it is severe call 911 IMMEDIATELY. If it is mild, please call our office.  Once we have confirmed authorization from your insurance company, we will call you to set up a date and time for your test. Based on how quickly your insurance processes prior authorizations requests, please allow up to 4 weeks to be contacted for scheduling your Cardiac CT appointment. Be advised that routine Cardiac CT appointments could be scheduled as many as 8 weeks after your provider has ordered it.  For non-scheduling related questions, please contact the cardiac imaging nurse navigator should you have any questions/concerns: Rockwell Alexandria, Cardiac Imaging Nurse Navigator Larey Brick, Cardiac Imaging Nurse Navigator [facility] Direct Office Dial: [phone]   For scheduling needs, including cancellations and rescheduling, please call [place], [phone].     Follow-Up: At [facility], you and your health needs are our priority.  As part of our continuing mission to provide you with exceptional heart care, we have created designated Provider Care Teams.  These Care Teams include your primary Cardiologist (physician) and Advanced Practice Providers (APPs -  Physician Assistants and Nurse  Practitioners) who all work together to provide you with the care you need, when you need it.  We recommend signing up for the patient portal called "MyChart".  Sign up information is provided on this After Visit Summary.  MyChart is  used to connect with patients for Virtual Visits (Telemedicine).  Patients are able to view lab/test results, encounter notes, upcoming appointments, etc.  Non-urgent messages can be sent to your provider as well.   To learn more about what you can do with MyChart, go to [email or web address].    Your next appointment:   3 month(s)  The format for your next appointment:   In Person  Provider:   Thomasene Ripple, DO   Other Instructions      Adopting a Healthy Lifestyle.  Know what a healthy weight is for you (roughly BMI <25) and aim to maintain this   Aim for 7+ servings of fruits and vegetables daily   65-80+ fluid ounces of water or unsweet tea for healthy kidneys   Limit to max 1 drink of alcohol per day; avoid smoking/tobacco   Limit animal fats in diet for cholesterol and heart health - choose grass fed whenever available   Avoid highly processed foods, and foods high in saturated/trans fats   Aim for low stress - take time to unwind and care for your mental health   Aim for 150 min of moderate intensity exercise weekly for heart health, and weights twice weekly for bone health   Aim for 7-9 hours of sleep daily   When it comes to diets, agreement about the perfect plan isnt easy to find, even among the experts. Experts at the [facility] [employer] developed an idea known as the Healthy Eating Plate. Just imagine a plate divided into logical, healthy portions.   The emphasis is on diet quality:   Load up on vegetables and fruits - one-half of your plate: Aim for color and variety, and remember that potatoes dont count.   Go for whole grains - one-quarter of your plate: Whole wheat, barley, wheat berries, quinoa, oats, brown  rice, and foods made with them. If you want pasta, go with whole wheat pasta.   Protein power - one-quarter of your plate: Fish, chicken, beans, and nuts are all healthy, versatile protein sources. Limit red meat.   The diet, however, does go beyond the plate, offering a few other suggestions.   Use healthy plant oils, such as olive, canola, soy, corn, sunflower and peanut. Check the labels, and avoid partially hydrogenated oil, which have unhealthy trans fats.   If youre thirsty, drink water. Coffee and tea are good in moderation, but skip sugary drinks and limit milk and dairy products to one or two daily servings.   The type of carbohydrate in the diet is more important than the amount. Some sources of carbohydrates, such as vegetables, fruits, whole grains, and beans-are healthier than others.   Finally, stay active  Signed, Thomasene Ripple, DO  04/27/2020 3:22 PM    [facility]

## 2020-04-27 NOTE — Patient Instructions (Signed)
Medication Instructions:  Your physician recommends that you continue on your current medications as directed. Please refer to the Current Medication list given to you today.  *If you need a refill on your cardiac medications before your next appointment, please call your pharmacy*   Lab Work: Your physician recommends that you return for lab work: TODAY BMET, Mag If you have labs (blood work) drawn today and your tests are completely normal, you will receive your results only by: Marland Kitchen MyChart Message (if you have MyChart) OR . A paper copy in the mail If you have any lab test that is abnormal or we need to change your treatment, we will call you to review the results.   Testing/Procedures: Your cardiac CT will be scheduled at:  [facility] [address] [place], [place] [address] [phone]  If scheduled at [facility], please arrive at the [facility] main entrance (entrance A) of [facility] 30 minutes prior to test start time. Proceed to the [facility] Radiology Department (first floor) to check-in and test prep.   Please follow these instructions carefully (unless otherwise directed):  On the Night Before the Test: . Be sure to Drink plenty of water. . Do not consume any caffeinated/decaffeinated beverages or chocolate 12 hours prior to your test. . Do not take any antihistamines 12 hours prior to your test.  On the Day of the Test: . Drink plenty of water until 1 hour prior to the test. . Do not eat any food 4 hours prior to the test. . You may take your regular medications prior to the test.  . Take metoprolol (Lopressor) two hours prior to test. . FEMALES- please wear underwire-free bra if available       After the Test: . Drink plenty of water. . After receiving IV contrast, you may experience a mild flushed feeling. This is normal. . On occasion, you may experience a mild rash up to 24 hours after the test. This is not dangerous. If  this occurs, you can take Benadryl 25 mg and increase your fluid intake. . If you experience trouble breathing, this can be serious. If it is severe call 911 IMMEDIATELY. If it is mild, please call our office.  Once we have confirmed authorization from your insurance company, we will call you to set up a date and time for your test. Based on how quickly your insurance processes prior authorizations requests, please allow up to 4 weeks to be contacted for scheduling your Cardiac CT appointment. Be advised that routine Cardiac CT appointments could be scheduled as many as 8 weeks after your provider has ordered it.  For non-scheduling related questions, please contact the cardiac imaging nurse navigator should you have any questions/concerns: Rockwell Alexandria, Cardiac Imaging Nurse Navigator Larey Brick, Cardiac Imaging Nurse Navigator [place] [facility] Direct Office Dial: [phone]   For scheduling needs, including cancellations and rescheduling, please call [place], [phone].     Follow-Up: At [facility], you and your health needs are our priority.  As part of our continuing mission to provide you with exceptional heart care, we have created designated Provider Care Teams.  These Care Teams include your primary Cardiologist (physician) and Advanced Practice Providers (APPs -  Physician Assistants and Nurse Practitioners) who all work together to provide you with the care you need, when you need it.  We recommend signing up for the patient portal called "MyChart".  Sign up information is provided on  this After Visit Summary.  MyChart is used to connect with patients for Virtual Visits (Telemedicine).  Patients are able to view lab/test results, encounter notes, upcoming appointments, etc.  Non-urgent messages can be sent to your provider as well.   To learn more about what you can do with MyChart, go to [email or web address].    Your next appointment:   3  month(s)  The format for your next appointment:   In Person  Provider:   Thomasene Ripple, DO   Other Instructions

## 2020-04-28 LAB — BASIC METABOLIC PANEL
BUN/Creatinine Ratio: 17 (ref 9–23)
BUN: 12 mg/dL (ref 6–24)
CO2: 23 mmol/L (ref 20–29)
Calcium: 9.5 mg/dL (ref 8.7–10.2)
Chloride: 101 mmol/L (ref 96–106)
Creatinine, Ser: 0.69 mg/dL (ref 0.57–1.00)
GFR calc Af Amer: 125 mL/min/{1.73_m2} (ref >59)
GFR calc non Af Amer: 108 mL/min/{1.73_m2} (ref >59)
Glucose: 108 mg/dL — ABNORMAL HIGH (ref 65–99)
Potassium: 4.4 mmol/L (ref 3.5–5.2)
Sodium: 140 mmol/L (ref 134–144)

## 2020-04-28 LAB — MAGNESIUM: Magnesium: 1.8 mg/dL (ref 1.6–2.3)

## 2020-05-09 ENCOUNTER — Telehealth (HOSPITAL_COMMUNITY): Payer: Self-pay | Admitting: Emergency Medicine

## 2020-05-09 NOTE — Telephone Encounter (Signed)
Reaching out to patient to offer assistance regarding upcoming cardiac imaging study; pt verbalizes understanding of appt date/time, parking situation and where to check in, pre-test NPO status and medications ordered, and verified current allergies; name and call back number provided for further questions should they arise Ryann Leavitt RN Navigator Cardiac Imaging [facility] [phone] office [phone] cell 

## 2020-05-11 ENCOUNTER — Ambulatory Visit (HOSPITAL_COMMUNITY)
Admission: RE | Admit: 2020-05-11 | Discharge: 2020-05-11 | Disposition: A | Discharge status: Home / Self Care | Payer: BC Managed Care – PPO | Source: Ambulatory Visit | Attending: Cardiology | Admitting: Cardiology

## 2020-05-11 ENCOUNTER — Other Ambulatory Visit: Payer: Self-pay

## 2020-05-11 DIAGNOSIS — R0600 Dyspnea, unspecified: Secondary | ICD-10-CM | POA: Insufficient documentation

## 2020-05-11 DIAGNOSIS — I472 Ventricular tachycardia: Secondary | ICD-10-CM | POA: Insufficient documentation

## 2020-05-11 DIAGNOSIS — R079 Chest pain, unspecified: Secondary | ICD-10-CM

## 2020-05-11 DIAGNOSIS — R0609 Other forms of dyspnea: Secondary | ICD-10-CM

## 2020-05-11 DIAGNOSIS — I4729 Other ventricular tachycardia: Secondary | ICD-10-CM

## 2020-05-11 MED ORDER — IOHEXOL 350 MG/ML SOLN
80.0000 mL | Freq: Once | INTRAVENOUS | Status: AC | PRN
  Administered 2020-05-11: 80 mL via INTRAVENOUS

## 2020-05-11 MED ORDER — NITROGLYCERIN 0.4 MG SL SUBL
SUBLINGUAL_TABLET | SUBLINGUAL | Status: AC
  Filled 2020-05-11: qty 2

## 2020-05-11 MED ORDER — NITROGLYCERIN 0.4 MG SL SUBL
0.8000 mg | SUBLINGUAL_TABLET | Freq: Once | SUBLINGUAL | Status: AC | PRN
  Administered 2020-05-11: 0.8 mg via SUBLINGUAL

## 2020-05-16 NOTE — Progress Notes (Deleted)
Subjective:  Patient ID: Judy George, female    DOB: 03-Mar-1981  Age: 40 y.o. MRN: [id number]  No chief complaint on file.   HPI   Current Outpatient Medications on File Prior to Visit  Medication Sig Dispense Refill  . metoprolol tartrate (LOPRESSOR) 100 MG tablet Take 2 hours prior to CT 1 tablet 0  . Plecanatide (TRULANCE) 3 MG TABS Take 1 tablet by mouth daily. 90 tablet 1   No current facility-administered medications on file prior to visit.   Past Medical History:  Diagnosis Date  . Alpha+ thalassemia (HCC)   . Anemia   . Chronic constipation 06/05/2016  . Diastasis recti 11/30/2015  . History of colon polyps   . History of irregular heartbeat 06/05/2016  . Kidney stones    Past Surgical History:  Procedure Laterality Date  . CESAREAN SECTION     X3  . COLONOSCOPY  2016   Dr Judie Petit in [place] 1 removed and it was non cancerous   . HERNIA REPAIR  04/2013  . TUBAL LIGATION      Family History  Problem Relation Age of Onset  . Hypertension Mother   . Colon polyps Mother   . Diabetes Maternal Aunt   . Heart attack Maternal Aunt   . Colon cancer Neg Hx   . Esophageal cancer Neg Hx    Social History   Socioeconomic History  . Marital status: Married    Spouse name: Not on file  . Number of children: 2  . Years of education: Not on file  . Highest education level: Not on file  Occupational History  . Occupation: Dentist  Tobacco Use  . Smoking status: Never Smoker  . Smokeless tobacco: Never Used  Vaping Use  . Vaping Use: Never used  Substance and Sexual Activity  . Alcohol use: Never  . Drug use: Never  . Sexual activity: Yes    Partners: Male    Birth control/protection: Surgical  Other Topics Concern  . Not on file  Social History Narrative  . Not on file   Social Determinants of Health   Financial Resource Strain: Not on file  Food Insecurity: Not on file  Transportation Needs: Not on file  Physical Activity: Not on file   Stress: Not on file  Social Connections: Not on file    Review of Systems  Constitutional: Negative for chills, fatigue and fever.  HENT: Negative for congestion, ear pain and sore throat.   Respiratory: Negative for cough and shortness of breath.   Cardiovascular: Negative for chest pain and palpitations.  Gastrointestinal: Negative for abdominal pain, constipation, diarrhea, nausea and vomiting.  Endocrine: Negative for polydipsia, polyphagia and polyuria.  Genitourinary: Negative for difficulty urinating and dysuria.  Musculoskeletal: Negative for arthralgias, back pain and myalgias.  Skin: Negative for rash.  Neurological: Negative for headaches.  Psychiatric/Behavioral: Negative for dysphoric mood. The patient is not nervous/anxious.      Objective:  There were no vitals taken for this visit.  BP/Weight 05/11/2020 04/27/2020 04/19/2020  Systolic BP 107 130 118  Diastolic BP 74 86 78  Wt. (Lbs) - 163.4 164  BMI - 29.89 30    Physical Exam Vitals reviewed.  Constitutional:      Appearance: Normal appearance.  Neck:     Vascular: No carotid bruit.  Cardiovascular:     Rate and Rhythm: Normal rate and regular rhythm.     Pulses: Normal pulses.     Heart sounds: Normal  heart sounds.  Pulmonary:     Effort: Pulmonary effort is normal.     Breath sounds: Normal breath sounds.  Abdominal:     General: Bowel sounds are normal.     Palpations: Abdomen is soft.     Tenderness: There is no abdominal tenderness.  Neurological:     Mental Status: She is alert and oriented to person, place, and time.  Psychiatric:        Mood and Affect: Mood normal.        Behavior: Behavior normal.     Diabetic Foot Exam - Simple   No data filed      Lab Results  Component Value Date   WBC 4.7 10/23/2019   HGB 10.7 (L) 10/23/2019   HCT 34.7 10/23/2019   PLT 308 10/23/2019   GLUCOSE 108 (H) 04/27/2020   CHOL 193 10/29/2019   TRIG 44 10/29/2019   HDL 75 10/29/2019   LDLCALC 109  (H) 10/29/2019   ALT 9 10/23/2019   AST 18 10/23/2019   NA 140 04/27/2020   K 4.4 04/27/2020   CL 101 04/27/2020   CREATININE 0.69 04/27/2020   BUN 12 04/27/2020   CO2 23 04/27/2020   TSH 0.968 10/23/2019      Assessment & Plan:   1. Palpitations  2. Chronic idiopathic constipation    No orders of the defined types were placed in this encounter.   No orders of the defined types were placed in this encounter.     I spent < time > minutes dedicated to the care of this patient on the date of this encounter to include face-to-face time with the patient, as well as: ***  Follow-up: No follow-ups on file.  An After Visit Summary was printed and given to the patient.  Blane Ohara, MD [facility] [phone]

## 2020-05-17 ENCOUNTER — Ambulatory Visit: Payer: BC Managed Care – PPO | Admitting: Family Medicine

## 2020-05-17 DIAGNOSIS — R002 Palpitations: Secondary | ICD-10-CM

## 2020-05-17 DIAGNOSIS — K5904 Chronic idiopathic constipation: Secondary | ICD-10-CM

## 2020-05-19 ENCOUNTER — Encounter: Payer: Self-pay | Admitting: Cardiology

## 2020-05-19 ENCOUNTER — Telehealth (INDEPENDENT_AMBULATORY_CARE_PROVIDER_SITE_OTHER): Payer: BC Managed Care – PPO | Admitting: Cardiology

## 2020-05-19 VITALS — HR 97 | Ht 62.0 in | Wt 162.0 lb

## 2020-05-19 DIAGNOSIS — I472 Ventricular tachycardia: Secondary | ICD-10-CM

## 2020-05-19 DIAGNOSIS — Q210 Ventricular septal defect: Secondary | ICD-10-CM | POA: Diagnosis not present

## 2020-05-19 DIAGNOSIS — I4729 Other ventricular tachycardia: Secondary | ICD-10-CM

## 2020-05-19 NOTE — Progress Notes (Signed)
Telehealth visit  Date:  05/19/2020   ID:  Judy, George September 11, 1980, MRN [id number]  The patient is at home and consent to this video visit. The provider is in the office.  PCP:  Blane Ohara, MD  Cardiologist:  Thomasene Ripple, DO  Electrophysiologist:  None   Evaluation Performed: Follow-up visit  Chief Complaint: Discussed results  History of Present Illness:    Judy George is a 40 y.o. female with a hx of irregular heartbeats is here today to be evaluated for palpitations, worsening shortness of breath and chest discomfort.  The patient was seen previously by Dr. Shawn Route that time she underwent a stress echocardiogram which was reported to be normal. She notes that her symptoms may have subsided but not completely resolved.  I saw the patient on March 23, 2018 at that time she complained that she has been experiencing shortness of breath as well as palpitations.  I last visit I reviewed with her testing result which her monitor did show evidence of 2 runs of NSVT.  Evenness we recommend she get a coronary CTA to rule out any coronary artery disease.  She was able to get this testing done she is here to discuss the result..  The patient does not have symptoms concerning for COVID-19 infection.   Past Medical History:  Diagnosis Date  . Alpha+ thalassemia (HCC)   . Anemia   . Chronic constipation 06/05/2016  . Diastasis recti 11/30/2015  . History of colon polyps   . History of irregular heartbeat 06/05/2016  . Kidney stones    Past Surgical History:  Procedure Laterality Date  . CESAREAN SECTION     X3  . COLONOSCOPY  2016   Dr Judie Petit in [place] 1 removed and it was non cancerous   . HERNIA REPAIR  04/2013  . TUBAL LIGATION       No outpatient medications have been marked as taking for the 05/19/20 encounter (Video Visit) with Rimsha Trembley, DO.     Allergies:   Patient has no known allergies.   Social History   Tobacco Use  . Smoking status:  Never Smoker  . Smokeless tobacco: Never Used  Vaping Use  . Vaping Use: Never used  Substance Use Topics  . Alcohol use: Never  . Drug use: Never     Family Hx: The patient's family history includes Colon polyps in her mother; Diabetes in her maternal aunt; Heart attack in her maternal aunt; Hypertension in her mother. There is no history of Colon cancer or Esophageal cancer.  ROS:   Review of Systems  Constitution: Negative for decreased appetite, fever and weight gain.  HENT: Negative for congestion, ear discharge, hoarse voice and sore throat.   Eyes: Negative for discharge, redness, vision loss in right eye and visual halos.  Cardiovascular: Negative for chest pain, dyspnea on exertion, leg swelling, orthopnea and palpitations.  Respiratory: Negative for cough, hemoptysis, shortness of breath and snoring.   Endocrine: Negative for heat intolerance and polyphagia.  Hematologic/Lymphatic: Negative for bleeding problem. Does not bruise/bleed easily.  Skin: Negative for flushing, nail changes, rash and suspicious lesions.  Musculoskeletal: Negative for arthritis, joint pain, muscle cramps, myalgias, neck pain and stiffness.  Gastrointestinal: Negative for abdominal pain, bowel incontinence, diarrhea and excessive appetite.  Genitourinary: Negative for decreased libido, genital sores and incomplete emptying.  Neurological: Negative for brief paralysis, focal weakness, headaches and loss of balance.  Psychiatric/Behavioral: Negative for altered mental status, depression and suicidal ideas.  Allergic/Immunologic: Negative for HIV exposure and persistent infections.    Prior CV studies:   The following studies were reviewed today:  oronary CTA May 11, 2020 aorta: Normal size.  No calcifications.  No dissection. Aortic Valve:  Trileaflet.  No calcifications. Coronary Arteries:  Normal coronary origin.  Right dominance. RCA is a large dominant artery that gives rise to PDA and  PLVB. There is no plaque. Left main is a large artery that gives rise to LAD and LCX arteries. LAD is a large vessel that has no plaque. LCX is a non-dominant artery that gives rise to one large OM1 branch. There is no plaque. Other findings: Normal pulmonary vein drainage into the left atrium. Normal left atrial appendage without a thrombus. Normal size of the pulmonary artery. Small Muscular ventricular septal defect appreciated.  IMPRESSION: 1. Coronary calcium score of 0. This was 0 percentile for age and sex matched control.  2. Normal coronary origin with right dominance.  3. No evidence of CAD.  4. Small Muscular Ventricular septal defect appreciated.   Transthoracic echo IMPRESSIONS April 20, 2020 1. Left ventricular ejection fraction, by estimation, is 55 to 60%. The  left ventricle has normal function. The left ventricle has no regional  wall motion abnormalities. Left ventricular diastolic parameters were  normal. The average left ventricular  global longitudinal strain is -18.9 %. The global longitudinal strain is  normal.  2. Right ventricular systolic function is normal. The right ventricular  size is normal. There is normal pulmonary artery systolic pressure.  3. The mitral valve is normal in structure. Trivial mitral valve  regurgitation. No evidence of mitral stenosis.  4. The aortic valve is normal in structure. Aortic valve regurgitation is  not visualized. No aortic stenosis is present.  5. The inferior vena cava is normal in size with greater than 50%  respiratory variability, suggesting right atrial pressure of 3 mmHg.   FINDINGS  Left Ventricle: Left ventricular ejection fraction, by estimation, is 55  to 60%. The left ventricle has normal function. The left ventricle has no  regional wall motion abnormalities. The average left ventricular global  longitudinal strain is -18.9 %.  The global longitudinal strain is normal. The left  ventricular internal  cavity size was normal in size. There is no left ventricular hypertrophy.  Left ventricular diastolic parameters were normal.   Right Ventricle: The right ventricular size is normal. No increase in  right ventricular wall thickness. Right ventricular systolic function is  normal. There is normal pulmonary artery systolic pressure. The tricuspid  regurgitant velocity is 2.40 m/s, and  with an assumed right atrial pressure of 3 mmHg, the estimated right  ventricular systolic pressure is 26.0 mmHg.   Left Atrium: Left atrial size was normal in size.   Right Atrium: Right atrial size was normal in size.   Pericardium: There is no evidence of pericardial effusion.   Mitral Valve: The mitral valve is normal in structure. Trivial mitral  valve regurgitation. No evidence of mitral valve stenosis.   Tricuspid Valve: The tricuspid valve is normal in structure. Tricuspid  valve regurgitation is not demonstrated. No evidence of tricuspid  stenosis.   Aortic Valve: The aortic valve is normal in structure. Aortic valve  regurgitation is not visualized. No aortic stenosis is present.   Pulmonic Valve: The pulmonic valve was normal in structure. Pulmonic valve  regurgitation is not visualized. No evidence of pulmonic stenosis.   Aorta: The aortic root is normal in size and structure.  Venous: The inferior vena cava is normal in size with greater than 50%  respiratory variability, suggesting right atrial pressure of 3 mmHg.   IAS/Shunts: No atrial level shunt detected by color flow Doppler.    Zio monitor  The patient wore the monitor for 13 days 16 hours starting 03/23/2020 . Indication: Palpitations  The minimum heart rate was 50 bpm, maximum heart rate was 174 bpm, and average heart rate was 84bpm. Predominant underlying rhythm was Sinus Rhythm.  2 Ventricular Tachycardia runs occurred, the run with the fastest interval lasting 9.0 seconds with a maximu  rate of 169 bpm; the run with the fastest interval was also the longest.   Premature atrial complexes were rare. Premature Ventricular complexes were rare.  No pauses, No AV block, no supraventricular tachycardia and no atrial fibrillation present. 3 patient triggered events and 1 diary events were associated with premature ventricular.   Conclusion: This study is remarkable for nonsustained ventricular tachycardia.  Labs/Other Tests and Data Reviewed:    EKG: None today     Recent Labs: 10/23/2019: ALT 9; Hemoglobin 10.7; Platelets 308; TSH 0.968 04/27/2020: BUN 12; Creatinine, Ser 0.69; Magnesium 1.8; Potassium 4.4; Sodium 140   Recent Lipid Panel Lab Results  Component Value Date/Time   CHOL 193 10/29/2019 12:02 PM   TRIG 44 10/29/2019 12:02 PM   HDL 75 10/29/2019 12:02 PM   CHOLHDL 2.6 10/29/2019 12:02 PM   LDLCALC 109 (H) 10/29/2019 12:02 PM    Wt Readings from Last 3 Encounters:  05/19/20 162 lb (73.5 kg)  04/27/20 163 lb 6.4 oz (74.1 kg)  04/19/20 164 lb (74.4 kg)     Objective:    Vital Signs:  Pulse 97   Ht 5\' 2"  (1.575 m)   Wt 162 lb (73.5 kg)   BMI 29.63 kg/m    No physical exam due to virtual visit.  ASSESSMENT & PLAN:    1. Small muscular VSD 2. NSVT  I educated patient with the finding of her coronary CTA.  No coronary artery disease.  I have evidence of very small muscular VSD.  For now the patient is asymptomatic there is no need for further testing.  What I like to do in the future is to probably repeat imaging with echocardiogram versus cardiac MRI.  At the time of evaluation will reassess to see the need of which one of this testing will be appropriate.  No palpitations for now.  We will continue to monitor.  She does develop palpitations I like to start the patient on low-dose beta-blocker but for now right now we will continue to monitor.  All of her questions have been answered during the time of our visit.  COVID-19 Education: The  signs and symptoms of COVID-19 were discussed with the patient and how to seek care for testing (follow up with PCP or arrange E-visit).  The importance of social distancing was discussed today.  Time:   Today, I have spent 7 minutes with the patient with telehealth technology discussing the above problems.     Medication Adjustments/Labs and Tests Ordered: Current medicines are reviewed at length with the patient today.  Concerns regarding medicines are outlined above.   Tests Ordered: No orders of the defined types were placed in this encounter.   Medication Changes: No orders of the defined types were placed in this encounter.   Follow Up: As needed  Signed , DO  05/19/2020 5:00 PM    [facility]

## 2020-05-19 NOTE — Patient Instructions (Signed)

## 2020-06-21 ENCOUNTER — Ambulatory Visit: Payer: BC Managed Care – PPO | Admitting: Cardiology

## 2020-07-25 ENCOUNTER — Ambulatory Visit: Payer: BC Managed Care – PPO | Admitting: Cardiology

## 2020-07-25 ENCOUNTER — Telehealth: Payer: Self-pay

## 2020-07-25 NOTE — Telephone Encounter (Signed)
Spoke with patient to see if she is having any symptoms, to see if she feels she needs to keep her appointment today. Patient states "No, I am doing good. Does she want me to call if I start having symptoms?" Patietn encouraged to reach out to [place] if she states to expericnes symptoms or if she feels like she needs to see [place].  Patient verbalizes understanding. No further questions or concerns at this time. Appointment for today was canceled for patient.

## 2021-09-07 ENCOUNTER — Telehealth: Payer: Self-pay | Admitting: Gastroenterology

## 2021-09-07 NOTE — Telephone Encounter (Signed)
LVM for pt to call back.  Since she got her medication through the PCP they will need to do her PA if her insurance requires it. She can make an office visit with Dr Chales Abrahams to discuss change in medications due to insurance cost

## 2021-09-07 NOTE — Telephone Encounter (Signed)
Inbound call from patient stating that she tried to get TRULANCE from her PCP and she went to pick up her prescription and it was going to cost her 500.00 dollars. Patient stated that she called her insurance and stated that the PA was only good for the 3 years. Patient is seeking advise if Dr. Chales Abrahams would like to see her, and if she can get mediation for cheaper or if there is alternative. Please advise.

## 2021-10-14 NOTE — Progress Notes (Unsigned)
[facility]  PATIENT: Judy George DOB: 06/26/1980  REFERRING DOCTOR OR PCP: Eunice Blase, PA-C SOURCE: Patient, notes from primary care,  _________________________________   HISTORICAL  CHIEF COMPLAINT:  Chief Complaint  Patient presents with   New Patient (Initial Visit)    Rm 1, w husband. Pt referred for numbness and tingling in face, hands, legs, and feet.     HISTORY OF PRESENT ILLNESS:  I had the pleasure of seeing your patient, Judy George, at [facility] for neurologic consultation regarding her numbness and tingling in the face, hands, legs and feet.  She is a 41 yo woman  with about 2 year history of  RLS symptoms in legs and numbness in her hands at night only.   Then about 2 months ao, she began to experience tingling paresthesias in her hands and feet and face.   Symptoms fluctuate and are not brought on by any position.   They occur day and night and have become more persistent.    Symptoms are bilateral.    She only has noted very slight weakness a fw times upon waking up when she also had tingling.    She denies pain.    She notes no change in bladder function.  She notes no change in balance or gait.   She denies change in vision.     CT of the head 09/11/2021 shows a normal brain.  Minimal calcification in the basal ganglia is unlikely to be clinically significant.  She has thallasemia.  No h/o DM or chronic neurologic issues.     She has chronic constipation.    REVIEW OF SYSTEMS: Constitutional: No fevers, chills, sweats, or change in appetite Eyes: No visual changes, double vision, eye pain Ear, nose and throat: No hearing loss, ear pain, nasal congestion, sore throat Cardiovascular: No chest pain, palpitations Respiratory:  No shortness of breath at rest or with exertion.   No wheezes GastrointestinaI: No nausea, vomiting, diarrhea, abdominal pain, fecal incontinence.  She has chronic constipation Genitourinary:   No dysuria, urinary retention or frequency.  No nocturia. Musculoskeletal:  No neck pain, back pain Integumentary: No rash, pruritus, skin lesions Neurological: as above Psychiatric: No depression at this time.  No anxiety Endocrine: No palpitations, diaphoresis, change in appetite, change in weigh or increased thirst Hematologic/Lymphatic:  No anemia, purpura, petechiae. Allergic/Immunologic: No itchy/runny eyes, nasal congestion, recent allergic reactions, rashes  ALLERGIES: No Known Allergies  HOME MEDICATIONS: No current outpatient medications on file.  PAST MEDICAL HISTORY: Past Medical History:  Diagnosis Date   Alpha+ thalassemia (HCC)    Anemia    Chronic constipation 06/05/2016   Diastasis recti 11/30/2015   History of colon polyps    History of irregular heartbeat 06/05/2016   Kidney stones     PAST SURGICAL HISTORY: Past Surgical History:  Procedure Laterality Date   CESAREAN SECTION     X3   COLONOSCOPY  2016   Dr Judie Petit in [place] 1 removed and it was non cancerous    HERNIA REPAIR  04/2013   TUBAL LIGATION      FAMILY HISTORY: Family History  Problem Relation Age of Onset   Hypertension Mother    Colon polyps Mother    Atrial fibrillation Mother    Diabetes Maternal Aunt    Heart attack Maternal Aunt    Colon cancer Neg Hx    Esophageal cancer Neg Hx     SOCIAL HISTORY:  Social History   Socioeconomic  History   Marital status: Married    Spouse name: Aneta Mins   Number of children: 4   Years of education: Not on file   Highest education level: Bachelor's degree (e.g., BA, AB, BS)  Occupational History   Occupation: Dentist  Tobacco Use   Smoking status: Never   Smokeless tobacco: Never  Vaping Use   Vaping Use: Never used  Substance and Sexual Activity   Alcohol use: Never   Drug use: Never   Sexual activity: Yes    Partners: Male    Birth control/protection: Surgical  Other Topics Concern   Not on file  Social History  Narrative   Lives at home with husband and 4 children   R handed   Caffeine: 1 C of coffee a day   Social Determinants of Corporate investment banker Strain: Not on file  Food Insecurity: Not on file  Transportation Needs: Not on file  Physical Activity: Not on file  Stress: Not on file  Social Connections: Not on file  Intimate Partner Violence: Not on file     PHYSICAL EXAM  Vitals:   10/17/21 1415  BP: 133/79  Pulse: 76  Weight: 166 lb 8 oz (75.5 kg)  Height: 5\' 2"  (1.575 m)    Body mass index is 30.45 kg/m.   General: The patient is well-developed and well-nourished and in no acute distress  HEENT:  Head is [employer]/AT.  Sclera are anicteric.  Funduscopic exam shows normal optic discs and retinal vessels.  Neck: No carotid bruits are noted.  The neck is nontender.  Cardiovascular: The heart has a regular rate and rhythm with a normal S1 and S2. There were no murmurs, gallops or rubs.    Skin: Extremities are without rash or  edema.  Musculoskeletal:  Back is nontender  Neurologic Exam  Mental status: The patient is alert and oriented x 3 at the time of the examination. The patient has apparent normal recent and remote memory, with an apparently normal attention span and concentration ability.   Speech is normal.  Cranial nerves: Extraocular movements are full. Pupils are equal, round, and reactive to light and accomodation.  Color vision is noral.    Facial symmetry is present. There is good facial sensation to soft touch bilaterally.Facial strength is normal.  Trapezius and sternocleidomastoid strength is normal. No dysarthria is noted.  The tongue is midline, and the patient has symmetric elevation of the soft palate. No obvious hearing deficits are noted.  Motor:  Muscle bulk is normal.   Tone is normal. Strength is  5 / 5 in all 4 extremities.   Sensory: Sensory testing is intact to pinprick, soft touch and vibration sensation in all 4  extremities.  Coordination: Cerebellar testing reveals good finger-nose-finger and heel-to-shin bilaterally.  Gait and station: Station is normal.   Gait is normal. Tandem gait is normal. Romberg is negative.   Reflexes: Deep tendon reflexes are symmetric and normal in arms, increased at knees with spread bilaterally and 3 at ankles without clonus.   Plantar responses are flexor.    DIAGNOSTIC DATA (LABS, IMAGING, TESTING) - I reviewed patient records, labs, notes, testing and imaging myself where available.  Lab Results  Component Value Date   WBC 4.7 10/23/2019   HGB 10.7 (L) 10/23/2019   HCT 34.7 10/23/2019   MCV 72 (L) 10/23/2019   PLT 308 10/23/2019      Component Value Date/Time   NA 140 04/27/2020 1526   K  4.4 04/27/2020 1526   CL 101 04/27/2020 1526   CO2 23 04/27/2020 1526   GLUCOSE 108 (H) 04/27/2020 1526   BUN 12 04/27/2020 1526   CREATININE 0.69 04/27/2020 1526   CALCIUM 9.5 04/27/2020 1526   PROT 7.2 10/23/2019 0918   ALBUMIN 4.3 10/23/2019 0918   AST 18 10/23/2019 0918   ALT 9 10/23/2019 0918   ALKPHOS 72 10/23/2019 0918   BILITOT 0.3 10/23/2019 0918   GFRNONAA 108 04/27/2020 1526   GFRAA 125 04/27/2020 1526   Lab Results  Component Value Date   CHOL 193 10/29/2019   HDL 75 10/29/2019   LDLCALC 109 (H) 10/29/2019   TRIG 44 10/29/2019   CHOLHDL 2.6 10/29/2019    Lab Results  Component Value Date   TSH 0.968 10/23/2019       ASSESSMENT AND PLAN  Numbness and tingling - Plan: MR BRAIN W WO CONTRAST, MR CERVICAL SPINE W WO CONTRAST  Hyperreflexia - Plan: MR BRAIN W WO CONTRAST, MR CERVICAL SPINE W WO CONTRAST  In summary, Ms. Golonka is a 41 year old woman with numbness that has worsened over the last couple months.  On examination, she has hyperreflexia in the legs.  The etiology of her numbness is not immediately apparent.  Because of the hyperreflexia, I am most concerned about a CNS process and we will check MRI of the brain and cervical  spine to further evaluate.  This will help [place] rule out demyelination such as MS, stroke, tumor and cervical myelopathy.  Based on the results of the studies, further evaluation may be necessary.  We also discussed that sometimes with sensory symptoms an identifiable cause is not found.  If that occurs and symptoms worsen we need to also check EMG/NCV.  She will return to see me as needed based on the results of the studies.  As symptoms are not painful, no prescription is necessary at this time.  She is advised to continue to stay active.  Thank you for asking me to see Ms. Upchurch.  Please let me know if I can be of further assistance with her or other patients in the future.   Ridhi Hoffert A. Epimenio Foot, MD, [facility] 10/17/2021, 2:53 PM Certified in Neurology, Clinical Neurophysiology, Sleep Medicine and Neuroimaging  [facility] [address] [place], [place] [address] [phone]

## 2021-10-17 ENCOUNTER — Encounter: Payer: Self-pay | Admitting: Neurology

## 2021-10-17 ENCOUNTER — Ambulatory Visit: Payer: BC Managed Care – PPO | Admitting: Neurology

## 2021-10-17 VITALS — BP 133/79 | HR 76 | Ht 62.0 in | Wt 166.5 lb

## 2021-10-17 DIAGNOSIS — R200 Anesthesia of skin: Secondary | ICD-10-CM | POA: Diagnosis not present

## 2021-10-17 DIAGNOSIS — R202 Paresthesia of skin: Secondary | ICD-10-CM | POA: Diagnosis not present

## 2021-10-17 DIAGNOSIS — R292 Abnormal reflex: Secondary | ICD-10-CM | POA: Diagnosis not present

## 2021-10-18 ENCOUNTER — Encounter: Payer: Self-pay | Admitting: Gastroenterology

## 2021-10-18 ENCOUNTER — Ambulatory Visit: Payer: BC Managed Care – PPO | Admitting: Gastroenterology

## 2021-10-18 VITALS — BP 110/78 | HR 99 | Ht 62.0 in | Wt 168.5 lb

## 2021-10-18 DIAGNOSIS — K581 Irritable bowel syndrome with constipation: Secondary | ICD-10-CM | POA: Diagnosis not present

## 2021-10-18 DIAGNOSIS — K219 Gastro-esophageal reflux disease without esophagitis: Secondary | ICD-10-CM | POA: Diagnosis not present

## 2021-10-18 DIAGNOSIS — Z8371 Family history of colonic polyps: Secondary | ICD-10-CM

## 2021-10-18 DIAGNOSIS — D509 Iron deficiency anemia, unspecified: Secondary | ICD-10-CM

## 2021-10-18 DIAGNOSIS — Z83719 Family history of colon polyps, unspecified: Secondary | ICD-10-CM

## 2021-10-18 MED ORDER — TRULANCE 3 MG PO TABS: 1.0000 | ORAL_TABLET | Freq: Every day | ORAL | 3 refills | Status: DC

## 2021-10-18 NOTE — Progress Notes (Signed)
Chief Complaint: FU  Referring Provider:  Blane Ohara, MD      ASSESSMENT AND PLAN;   #1. IBS-C. Neg colon 01/2019. Nl TSH 05/2016 #2. IDA with neg EGD with SB bx, neg colon 01/2019, neg CT AP 10/2019. (Thal trait in past per Dr Melvyn Neth) #3. GERD with NCCP.  Neg cardiac work-up. #4. FH colon polyps (mom < 50)   Plan: - Miralax 17g po qd with fiber - Trulance 3mg  po qd #90. She needs pre-auth.  - Increase water intake - Walking 30 min/day.  - Recall Colon 01/2024    HPI:    Judy George is a 41 y.o. female  53, mom is a patient of ours  Was doing great on MiraLAX and Trulance 3mg .  Unfortunately, her INS has stopped covering Trulance.  Out-of-pocket it will cost her $500.  She has tried multiple medications including over-the-counter fiber, twice daily MiraLAX, Linzess 290 mcg, 145 mcg and 72 mcg without any relief.  Denies having any nausea, vomiting, heartburn, regurgitation odynophagia or dysphagia.  No weight loss or loss of appetite.  No melena or hematochezia.  She had some abdominal discomfort around umbilicus related to previous ventral hernia repair.  From previous notes Longstanding history of the patient ever since first pregnancy with daughter 15 years ago.  She does pass pellet-like stools, associated lower abdominal cramps which gets better with defecation, abdominal bloating.  If she does not take any laxatives, she would have 1 bowel movement in 1 to 2 weeks.  She does try to drink plenty of water.  Has tried multiple medications including MiraLAX which did not work very well.  Linzess 290 works off and on.  Has tried herbal tea as well.  Denies nonsteroidal use.  Denies intake of calcium    Past GI procedures: Colonoscopy 01/23/2019  -Small internal hemorrhoids. -Otherwise normal colonoscopy to TI. The colon was highly redundant. -Repeat in 5 years d/t FH of polyps.  Earlier, if with any new problems EGD 01/23/2019  -Minimal  gastritis -Otherwise normal EGD. -Neg SB bx and gastric biopsies  CT Abdo/pelvis with p.o. and IV contrast 10/2019 -Antral thickening -No acute abnormalities -S/P ventral hernia repair.  No recurrent hernias.  -had colonoscopy performed by Dr. 13/08/2018 ?  2015/2016-had one colonic polyp removed.  Mom also had polyps. Past Medical History:  Diagnosis Date   Alpha+ thalassemia (HCC)    Anemia    Chronic constipation 06/05/2016   Diastasis recti 11/30/2015   History of colon polyps    History of irregular heartbeat 06/05/2016   Kidney stones     Past Surgical History:  Procedure Laterality Date   CESAREAN SECTION     X3   COLONOSCOPY  2016   Dr 06/07/2016 in [place] 1 removed and it was non cancerous    HERNIA REPAIR  04/2013   TUBAL LIGATION      Family History  Problem Relation Age of Onset   Hypertension Mother    Colon polyps Mother    Atrial fibrillation Mother    Diabetes Maternal Aunt    Heart attack Maternal Aunt    Colon cancer Neg Hx    Esophageal cancer Neg Hx     Social History   Tobacco Use   Smoking status: Never   Smokeless tobacco: Never  Vaping Use   Vaping Use: Never used  Substance Use Topics   Alcohol use: Never   Drug use: Never    Current Outpatient Medications  Medication Sig Dispense Refill   FIBER ADULT GUMMIES PO Take by mouth. Take 3 gummies daily     polyethylene glycol powder (MIRALAX) 17 GM/SCOOP powder Take by mouth daily.     No current facility-administered medications for this visit.    No Known Allergies  Review of Systems:  neg     Physical Exam:    BP 110/78   Pulse 99   Ht 5\' 2"  (1.575 m)   Wt 168 lb 8 oz (76.4 kg)   SpO2 100%   BMI 30.82 kg/m  Filed Weights   10/18/21 1041  Weight: 168 lb 8 oz (76.4 kg)   Constitutional:  Well-developed, in no acute distress. Psychiatric: Normal mood and affect. Behavior is normal. HEENT: Pupils normal.  Conjunctivae are normal. No scleral icterus. Cardiovascular:  Normal rate, regular rhythm. No edema Pulmonary/chest: Effort normal and breath sounds normal. No wheezing, rales or rhonchi. Abdominal: Soft, nondistended. Nontender. Bowel sounds active throughout. There are no masses palpable. No hepatomegaly.  Rectal diastases.  No hernias. Rectal:  defered Neurological: Alert and oriented to person place and time. Skin: Skin is warm and dry. No rashes noted.    12/18/21, MD 10/18/2021, 10:48 AM  Cc: 12/18/2021, MD

## 2021-10-18 NOTE — Patient Instructions (Addendum)
_______________________________________________________  If you are age 43 or older, your body mass index should be between 23-30. Your Body mass index is 30.82 kg/m. If this is out of the aforementioned range listed, please consider follow up with your Primary Care Provider.  If you are age 72 or younger, your body mass index should be between 19-25. Your Body mass index is 30.82 kg/m. If this is out of the aformentioned range listed, please consider follow up with your Primary Care Provider.   ________________________________________________________  The [facility] providers would like to encourage you to use [facility] to communicate with providers for non-urgent requests or questions.  Due to long hold times on the telephone, sending your provider a message by [facility] may be a faster and more efficient way to get a response.  Please allow 48 business hours for a response.  Please remember that this is for non-urgent requests.  _______________________________________________________  Please purchase the following medications over the counter and take as directed: Miralax daily with fiber  We have sent the following medications to your pharmacy for you to pick up at your convenience: Trulance a savings card has been given as well  Increase water intake  Walk 30 minutes a day  A patient assistance program application has been given for trulance with Dr Urban Gibson signature. Fill out and mail back or fax back to the company and date Dr Urban Gibson signature.  Call 2 months prior to November 2025 to see about scheduling a colonoscopy.   Thank you,  Dr. Lynann Bologna

## 2021-10-19 ENCOUNTER — Telehealth: Payer: Self-pay | Admitting: Neurology

## 2021-10-19 NOTE — Telephone Encounter (Signed)
90 mins MRI brain w/wo & MRI cervical spine w/wo Dr. Gershon Mussel Berkley Harvey: [phone] exp. 10/19/21-11/17/21 scheduled at [facility] 10/24/21 at 4pm

## 2021-10-24 ENCOUNTER — Ambulatory Visit (INDEPENDENT_AMBULATORY_CARE_PROVIDER_SITE_OTHER): Payer: BC Managed Care – PPO

## 2021-10-24 DIAGNOSIS — R200 Anesthesia of skin: Secondary | ICD-10-CM | POA: Diagnosis not present

## 2021-10-24 DIAGNOSIS — R202 Paresthesia of skin: Secondary | ICD-10-CM

## 2021-10-24 DIAGNOSIS — R292 Abnormal reflex: Secondary | ICD-10-CM

## 2021-10-24 MED ORDER — GADOBENATE DIMEGLUMINE 529 MG/ML IV SOLN
15.0000 mL | Freq: Once | INTRAVENOUS | Status: AC | PRN
  Administered 2021-10-24: 15 mL via INTRAVENOUS

## 2022-01-22 ENCOUNTER — Other Ambulatory Visit: Payer: Self-pay

## 2022-01-22 MED ORDER — TRULANCE 3 MG PO TABS: 1.0000 | ORAL_TABLET | Freq: Every day | ORAL | 2 refills | Status: DC

## 2022-01-25 ENCOUNTER — Telehealth: Payer: Self-pay

## 2022-01-25 ENCOUNTER — Other Ambulatory Visit (HOSPITAL_COMMUNITY): Payer: Self-pay

## 2022-01-25 NOTE — Telephone Encounter (Signed)
Pharmacy Patient Advocate Encounter  Prior Authorization for Trulance 3MG  has been approved.    PA# Effective dates: 01/24/2022 through 01/25/2023    Patient Advocate Encounter   Received notification from [employer] that prior authorization for Trulance 3mg  is required.   PA submitted on 01/24/2022 Key [id number] Status is pending       , CPHT Pharmacy Patient Advocate Specialist [facility] Patient Advocate Team Direct Number: [phone] Fax: [phone]

## 2022-01-26 ENCOUNTER — Ambulatory Visit (INDEPENDENT_AMBULATORY_CARE_PROVIDER_SITE_OTHER): Payer: BC Managed Care – PPO | Admitting: Family Medicine

## 2022-01-26 VITALS — BP 110/82 | HR 88 | Temp 98.2°F | Ht 62.0 in | Wt 166.0 lb

## 2022-01-26 DIAGNOSIS — D563 Thalassemia minor: Secondary | ICD-10-CM

## 2022-01-26 DIAGNOSIS — K581 Irritable bowel syndrome with constipation: Secondary | ICD-10-CM

## 2022-01-26 DIAGNOSIS — R011 Cardiac murmur, unspecified: Secondary | ICD-10-CM

## 2022-01-26 DIAGNOSIS — Z23 Encounter for immunization: Secondary | ICD-10-CM | POA: Diagnosis not present

## 2022-01-26 DIAGNOSIS — N200 Calculus of kidney: Secondary | ICD-10-CM

## 2022-01-26 NOTE — Patient Instructions (Signed)
It was nice meeting you guys this visit.  You can schedule a follow-up appointment for physical at your earliest convenience.

## 2022-01-26 NOTE — Progress Notes (Unsigned)
Patient presents to clinic today to establish care and f/u on ongoing concerns.  Pt accompanied by 2 of her daughters.  SUBJECTIVE: PMH: Patient is a 41 year old female previously seen by Judy Binder, MD at [facility] [place]. Patient seen by cardiologist Judy San, MD  IBS-C: -Currently on Trulance 3 mg daily -In the past tried Linzess which did not work -Advised to have colonoscopies every 5 years  Anemia: -2/2 history of thalassemia  History of renal calculi: -Had Pyelo -Now drinking at least 8 bottles of water per day -Taking tamsulosin 1 mg daily and cranberry Gummies daily -Previously seen by urology.  Allergies: NKDA  Past surgical history: Hernia repair 2015 C-section 2006, 2008, 2012  Social hx: Pt is married.  She is a Midwife   Health Maintenance: Dental --Pharmacist, hospital Vision --[facility] Immunizations --influenza vaccine 2022 Colonoscopy --2021 Mammogram --03/2021 PAP --2022 Bone Density --    Family medical history: Mom-A-fib, HLD MGM-kidney stones, asthma HLD, HTN, kidney disease MGF-MI, HLD, HTN  Past Medical History:  Diagnosis Date   Alpha+ thalassemia (HCC)    Anemia    Chronic constipation 06/05/2016   Diastasis recti 11/30/2015   History of colon polyps    History of irregular heartbeat 06/05/2016   Kidney stones     Past Surgical History:  Procedure Laterality Date   CESAREAN SECTION     X3   COLONOSCOPY  2016   Dr Judie Petit in [place] 1 removed and it was non cancerous    HERNIA REPAIR  04/2013   TUBAL LIGATION      Current Outpatient Medications on File Prior to Visit  Medication Sig Dispense Refill   FIBER ADULT GUMMIES PO Take by mouth. Take 3 gummies daily     fluconazole (DIFLUCAN) 150 MG tablet Take 150 mg by mouth once.     Plecanatide (TRULANCE) 3 MG TABS Take 1 tablet by mouth daily in the afternoon. 90 tablet 2   polyethylene glycol powder (MIRALAX) 17 GM/SCOOP powder Take by mouth  daily.     tamsulosin (FLOMAX) 0.4 MG CAPS capsule Take 0.4 mg by mouth.     No current facility-administered medications on file prior to visit.    No Known Allergies  Family History  Problem Relation Age of Onset   Hypertension Mother    Colon polyps Mother    Atrial fibrillation Mother    Diabetes Maternal Aunt    Heart attack Maternal Aunt    Colon cancer Neg Hx    Esophageal cancer Neg Hx     Social History   Socioeconomic History   Marital status: Married    Spouse name: Aneta Mins   Number of children: 4   Years of education: Not on file   Highest education level: Bachelor's degree (e.g., BA, AB, BS)  Occupational History   Occupation: Dentist  Tobacco Use   Smoking status: Never   Smokeless tobacco: Never  Vaping Use   Vaping Use: Never used  Substance and Sexual Activity   Alcohol use: Never   Drug use: Never   Sexual activity: Yes    Partners: Male    Birth control/protection: Surgical  Other Topics Concern   Not on file  Social History Narrative   Lives at home with husband and 4 children   R handed   Caffeine: 1 C of coffee a day   Social Determinants of Corporate investment banker Strain: Not on file  Food Insecurity:  Not on file  Transportation Needs: Not on file  Physical Activity: Not on file  Stress: Not on file  Social Connections: Not on file  Intimate Partner Violence: Not on file    ROS General: Denies fever, chills, night sweats, changes in weight, changes in appetite HEENT: Denies headaches, ear pain, changes in vision, rhinorrhea, sore throat CV: Denies CP, palpitations, SOB, orthopnea Pulm: Denies SOB, cough, wheezing GI: Denies abdominal pain, nausea, vomiting, diarrhea+ IBS-C GU: Denies dysuria, hematuria, frequency, vaginal discharge Msk: Denies muscle cramps, joint pains Neuro: Denies weakness, numbness, tingling Skin: Denies rashes, bruising Psych: Denies depression, anxiety, hallucinations   BP 110/82 (BP  Location: Left Arm, Patient Position: Sitting, Cuff Size: Normal)   Pulse 88   Temp 98.2 F (36.8 C) (Oral)   Ht 5\' 2"  (1.575 m)   Wt 166 lb (75.3 kg)   LMP 01/15/2022 (Approximate)   SpO2 97%   BMI 30.36 kg/m   Physical Exam Gen. Pleasant, well developed, well-nourished, in NAD HEENT - [place]/AT, PERRL, EOMI, conjunctive clear, no scleral icterus, no nasal drainage Lungs: no use of accessory muscles, CTAB, no wheezes, rales or rhonchi Cardiovascular: RRR, 2/6 murmur, no r/g, no peripheral edema Neuro:  A&Ox3, CN II-XII intact, normal gait Skin:  Warm, dry, intact, no lesions  No results found for this or any previous visit (from the past 2160 hour(s)).  Assessment/Plan: Renal calculi -Stable -Lifestyle modifications encouraged -Continue current medications and adequate water intake -Continue follow-up with urology  Irritable bowel syndrome with constipation -Continue Trulance -Continue lifestyle occasions -Continue follow-up with [facility]  Need for influenza vaccination  - Plan: Flu Vaccine QUAD 6+ mos PF IM (Fluarix Quad PF)  Thalassemia minor  Heart murmur -Stable -Continue follow-up with cardiology  F/u as needed for CPE  2161, MD

## 2022-02-14 ENCOUNTER — Telehealth: Payer: Self-pay

## 2022-02-14 ENCOUNTER — Encounter: Payer: Self-pay | Admitting: Family Medicine

## 2022-02-14 NOTE — Telephone Encounter (Signed)
Last OV 01/26/22 Order placed for flu vaccine, but not documented.  LVM for pt to call back to confirm that she received vaccine.  If pt calls back, please ask her if vaccine was administered during 11/10 OV or transfer to [place] if available.

## 2022-03-14 ENCOUNTER — Telehealth: Payer: Self-pay | Admitting: Gastroenterology

## 2022-03-14 NOTE — Telephone Encounter (Signed)
Pt made aware of Dr. Marina Goodell recommendations: Detailed instructions were given for one half MiraLAX prep.  Pt notified there after she should take 2 doses of MiraLAX daily and adjust as needed.  Pt verbalized understanding with all questions answered.

## 2022-03-14 NOTE — Telephone Encounter (Signed)
I would recommend a higher dose of MiraLAX and for a longer period of time. Have her do one half MiraLAX prep, as if she was having a colonoscopy.  Thereafter, she should take 2 doses of MiraLAX daily and adjust as needed.

## 2022-03-14 NOTE — Telephone Encounter (Signed)
Dr. Chales Abrahams Judy George Judy George stated that she is having issues with severe constipation: Judy George stated that her last BM was a week and a half ago and this morning she had another BM with only a few small pellets after using a dulcolax suppository: : Judy George stated that she does not feel that her Trulance is working as it should: Judy George stated that she has been taking Miralax daily for several days and Metamucil daily for a week now, drinking 8 bottles of water daily: Judy George stated with the constipation that her Hemorrhoids have flared up as well:   Please advise as DOD for constipation and Hemorrhoids:

## 2022-03-14 NOTE — Telephone Encounter (Signed)
Patient called, requesting a new medication for her constipation. She stated that the Trulance, has not been working and she has not had a bowel movement in a couple days. Please advise.

## 2022-03-14 NOTE — Telephone Encounter (Signed)
Viviann Spare can you please advise? I wasn't sure if it needs to be sent to the doc of the day

## 2022-04-03 LAB — HM MAMMOGRAPHY

## 2022-04-05 ENCOUNTER — Encounter: Payer: Self-pay | Admitting: Family Medicine

## 2022-04-09 ENCOUNTER — Other Ambulatory Visit (HOSPITAL_COMMUNITY)
Admission: RE | Admit: 2022-04-09 | Discharge: 2022-04-09 | Disposition: A | Discharge status: Home / Self Care | Payer: BC Managed Care – PPO | Source: Ambulatory Visit | Attending: Family Medicine | Admitting: Family Medicine

## 2022-04-09 ENCOUNTER — Encounter: Payer: Self-pay | Admitting: Family Medicine

## 2022-04-09 ENCOUNTER — Ambulatory Visit (INDEPENDENT_AMBULATORY_CARE_PROVIDER_SITE_OTHER): Payer: BC Managed Care – PPO | Admitting: Family Medicine

## 2022-04-09 VITALS — BP 118/80 | HR 81 | Temp 98.2°F | Ht 61.5 in | Wt 168.2 lb

## 2022-04-09 DIAGNOSIS — Z124 Encounter for screening for malignant neoplasm of cervix: Secondary | ICD-10-CM

## 2022-04-09 DIAGNOSIS — Z0000 Encounter for general adult medical examination without abnormal findings: Secondary | ICD-10-CM

## 2022-04-09 DIAGNOSIS — D563 Thalassemia minor: Secondary | ICD-10-CM | POA: Diagnosis not present

## 2022-04-09 DIAGNOSIS — K581 Irritable bowel syndrome with constipation: Secondary | ICD-10-CM | POA: Insufficient documentation

## 2022-04-09 DIAGNOSIS — E7841 Elevated Lipoprotein(a): Secondary | ICD-10-CM | POA: Diagnosis not present

## 2022-04-09 LAB — LIPID PANEL
Cholesterol: 187 mg/dL (ref 0–200)
HDL: 67.3 mg/dL (ref >39.00)
LDL Cholesterol: 106 mg/dL — ABNORMAL HIGH (ref 0–99)
NonHDL: 119.25
Total CHOL/HDL Ratio: 3
Triglycerides: 64 mg/dL (ref 0.0–149.0)
VLDL: 12.8 mg/dL (ref 0.0–40.0)

## 2022-04-09 LAB — COMPREHENSIVE METABOLIC PANEL
ALT: 11 U/L (ref 0–35)
AST: 21 U/L (ref 0–37)
Albumin: 4.2 g/dL (ref 3.5–5.2)
Alkaline Phosphatase: 55 U/L (ref 39–117)
BUN: 7 mg/dL (ref 6–23)
CO2: 27 mEq/L (ref 19–32)
Calcium: 9.4 mg/dL (ref 8.4–10.5)
Chloride: 101 mEq/L (ref 96–112)
Creatinine, Ser: 0.54 mg/dL (ref 0.40–1.20)
GFR: 112.59 mL/min (ref >60.00)
Glucose, Bld: 88 mg/dL (ref 70–99)
Potassium: 3.7 mEq/L (ref 3.5–5.1)
Sodium: 136 mEq/L (ref 135–145)
Total Bilirubin: 0.4 mg/dL (ref 0.2–1.2)
Total Protein: 7.9 g/dL (ref 6.0–8.3)

## 2022-04-09 LAB — CBC WITH DIFFERENTIAL/PLATELET
Basophils Absolute: 0.1 10*3/uL (ref 0.0–0.1)
Basophils Relative: 1 % (ref 0.0–3.0)
Eosinophils Absolute: 0.1 10*3/uL (ref 0.0–0.7)
Eosinophils Relative: 1 % (ref 0.0–5.0)
HCT: 33.8 % — ABNORMAL LOW (ref 36.0–46.0)
Hemoglobin: 10.7 g/dL — ABNORMAL LOW (ref 12.0–15.0)
Lymphocytes Relative: 38.4 % (ref 12.0–46.0)
Lymphs Abs: 2 10*3/uL (ref 0.7–4.0)
MCHC: 31.7 g/dL (ref 30.0–36.0)
MCV: 69.5 fl — ABNORMAL LOW (ref 78.0–100.0)
Monocytes Absolute: 0.3 10*3/uL (ref 0.1–1.0)
Monocytes Relative: 6.1 % (ref 3.0–12.0)
Neutro Abs: 2.8 10*3/uL (ref 1.4–7.7)
Neutrophils Relative %: 53.5 % (ref 43.0–77.0)
Platelets: 304 10*3/uL (ref 150.0–400.0)
RBC: 4.87 Mil/uL (ref 3.87–5.11)
RDW: 15.9 % — ABNORMAL HIGH (ref 11.5–15.5)
WBC: 5.2 10*3/uL (ref 4.0–10.5)

## 2022-04-09 LAB — TSH: TSH: 1.35 u[IU]/mL (ref 0.35–5.50)

## 2022-04-09 NOTE — Assessment & Plan Note (Signed)
-  LDL 117 on 03/16/2021 -Lifestyle modifications

## 2022-04-09 NOTE — Assessment & Plan Note (Signed)
Stable.  H/o anemia.  Hgb 11.0 on 01/24/2022.

## 2022-04-09 NOTE — Assessment & Plan Note (Signed)
Stable -Continue Trulance 3 mg. -Advised to take MiraLAX daily. -Continue follow-up with GI

## 2022-04-09 NOTE — Progress Notes (Signed)
Established Patient Office Visit   Subjective  Patient ID: Judy George, female    DOB: 12-15-1980  Age: 42 y.o. MRN: [id number]  Chief Complaint  Patient presents with   Annual Exam    Patient seen for CPE.  States doing well overall.  Had issues with constipation over the holidays.  Contacted GI.  Advised to take MiraLAX for 2 days which seemed to help.  Pt on Trulance which works sometimes.  Patient drinking more water and fluids due to renal calculi a few months ago.  Patient had mammogram 04/03/2022.  Unsure of last Pap, 07/27/2017 in chart however 12/05/2020 also noted.  Noticed some vaginal d/c.  Endorses normal Paps in the past.  Had issues with Candida yeast infection while on antibiotics for renal calculi.  Eating activity a yogurt x 2 weeks.      ROS Negative unless stated above    Objective:     BP 118/80 (BP Location: Left Arm, Patient Position: Sitting, Cuff Size: Large)   Pulse 81   Temp 98.2 F (36.8 C) (Oral)   Ht 5' 1.5" (1.562 m)   Wt 168 lb 3.2 oz (76.3 kg)   SpO2 99%   BMI 31.27 kg/m    Physical Exam Constitutional:      Appearance: Normal appearance.  HENT:     Head: Normocephalic and atraumatic.     Right Ear: Tympanic membrane, ear canal and external ear normal.     Left Ear: Tympanic membrane, ear canal and external ear normal.     Nose: Nose normal.     Mouth/Throat:     Mouth: Mucous membranes are moist.     Pharynx: No oropharyngeal exudate or posterior oropharyngeal erythema.  Eyes:     General: No scleral icterus.    Extraocular Movements: Extraocular movements intact.     Conjunctiva/sclera: Conjunctivae normal.     Pupils: Pupils are equal, round, and reactive to light.  Neck:     Thyroid: No thyromegaly.  Cardiovascular:     Rate and Rhythm: Normal rate and regular rhythm.     Pulses: Normal pulses.     Heart sounds: Normal heart sounds. No murmur heard.    No friction rub.  Pulmonary:     Effort: Pulmonary effort is  normal.     Breath sounds: Normal breath sounds. No wheezing, rhonchi or rales.  Abdominal:     General: Bowel sounds are normal.     Palpations: Abdomen is soft.     Tenderness: There is no abdominal tenderness.  Genitourinary:    General: Normal vulva.     Exam position: Lithotomy position.     Pubic Area: No rash.      Labia:        Right: No lesion.        Left: No lesion.      Urethra: No prolapse or urethral lesion.     Vagina: Vaginal discharge present.     Rectum: Normal.     Comments: Normal external female genitalia.  Normal urethral meatus, perineum, and rectum.  Normal vaginal rugae.  Yellowish discharge noted in vaginal vault.  No cervical lesions or CMT. Musculoskeletal:        General: No deformity. Normal range of motion.  Lymphadenopathy:     Cervical: No cervical adenopathy.  Skin:    General: Skin is warm and dry.     Findings: No lesion.  Neurological:     General: No focal deficit present.  Mental Status: She is alert and oriented to person, place, and time.  Psychiatric:        Mood and Affect: Mood normal.        Thought Content: Thought content normal.      No results found for any visits on 04/09/22.    Assessment & Plan:  Well adult exam -Anticipatory guidance given including wearing seatbelts, smoke detectors in the home, increasing physical activity, increasing p.o. intake of water and vegetables. -Mammogram done 04/03/2022 -Colonoscopy done 01/23/2019 with plans to repeat next year 2/two 5-year recall -Pap done this visit -Immunizations reviewed.  Influenza, Tdap up-to-date. -     Lipid panel  Screening for cervical cancer -     Cytology - PAP  Thalassemia minor Assessment & Plan: Stable.  H/o anemia.  Hgb 11.0 on 01/24/2022.  Orders: -     CBC with Differential/Platelet  Irritable bowel syndrome with constipation Assessment & Plan: Stable -Continue Trulance 3 mg. -Advised to take MiraLAX daily. -Continue follow-up with  GI  Orders: -     Lipid panel -     Comprehensive metabolic panel -     TSH  Elevated lipoprotein(a) Assessment & Plan: -LDL 117 on 03/16/2021 -Lifestyle modifications  Orders: -     Lipid panel    Return if symptoms worsen or fail to improve.   Billie Ruddy, MD

## 2022-04-16 ENCOUNTER — Other Ambulatory Visit: Payer: Self-pay | Admitting: Family Medicine

## 2022-04-16 DIAGNOSIS — D563 Thalassemia minor: Secondary | ICD-10-CM

## 2022-04-16 DIAGNOSIS — D599 Acquired hemolytic anemia, unspecified: Secondary | ICD-10-CM

## 2022-04-16 LAB — CYTOLOGY - PAP
Comment: NEGATIVE
Comment: NEGATIVE
Diagnosis: NEGATIVE
High risk HPV: NEGATIVE
Trichomonas: NEGATIVE

## 2022-05-07 ENCOUNTER — Telehealth: Payer: Self-pay | Admitting: Family Medicine

## 2022-05-07 NOTE — Telephone Encounter (Signed)
Pt received notice that she needs a repeat lab draw. Asking if the order can be sent to a lab near her, suggested [facility] in [place]

## 2022-05-09 NOTE — Telephone Encounter (Signed)
OK to placed these orders for [facility] [place]?

## 2022-05-11 NOTE — Telephone Encounter (Signed)
Spoke to pt and she stated that she will get it done here. Pt stated that she is coming here to substitute and the office is close by. No further action needed.

## 2022-05-11 NOTE — Telephone Encounter (Signed)
That's fine

## 2022-05-11 NOTE — Addendum Note (Signed)
Addended by: Gwenyth Ober R on: 05/11/2022 05:00 PM   Modules accepted: Orders

## 2022-05-14 ENCOUNTER — Other Ambulatory Visit: Payer: BC Managed Care – PPO

## 2022-05-14 DIAGNOSIS — D563 Thalassemia minor: Secondary | ICD-10-CM

## 2022-05-14 DIAGNOSIS — D599 Acquired hemolytic anemia, unspecified: Secondary | ICD-10-CM

## 2022-05-15 LAB — CBC WITH DIFFERENTIAL/PLATELET
Basophils Absolute: 0.1 10*3/uL (ref 0.0–0.2)
Basos: 1 %
EOS (ABSOLUTE): 0 10*3/uL (ref 0.0–0.4)
Eos: 1 %
Hematocrit: 35.2 % (ref 34.0–46.6)
Hemoglobin: 10.7 g/dL — ABNORMAL LOW (ref 11.1–15.9)
Immature Grans (Abs): 0 10*3/uL (ref 0.0–0.1)
Immature Granulocytes: 0 %
Lymphocytes Absolute: 1.6 10*3/uL (ref 0.7–3.1)
Lymphs: 38 %
MCH: 21.8 pg — ABNORMAL LOW (ref 26.6–33.0)
MCHC: 30.4 g/dL — ABNORMAL LOW (ref 31.5–35.7)
MCV: 72 fL — ABNORMAL LOW (ref 79–97)
Monocytes Absolute: 0.2 10*3/uL (ref 0.1–0.9)
Monocytes: 5 %
Neutrophils Absolute: 2.4 10*3/uL (ref 1.4–7.0)
Neutrophils: 55 %
Platelets: 318 10*3/uL (ref 150–450)
RBC: 4.91 x10E6/uL (ref 3.77–5.28)
RDW: 15 % (ref 11.7–15.4)
WBC: 4.3 10*3/uL (ref 3.4–10.8)

## 2022-05-15 LAB — IRON,TIBC AND FERRITIN PANEL
Ferritin: 10 ng/mL — ABNORMAL LOW (ref 15–150)
Iron Saturation: 11 % — ABNORMAL LOW (ref 15–55)
Iron: 45 ug/dL (ref 27–159)
Total Iron Binding Capacity: 406 ug/dL (ref 250–450)
UIBC: 361 ug/dL (ref 131–425)

## 2022-06-04 IMAGING — CT CT HEART MORP W/ CTA COR W/ SCORE W/ CA W/CM &/OR W/O CM
4 of 7 series · 8 of 20 positions shown, 9 images · non-contrast
Comparison: 02/07/2018 chest radiograph.
COMPARISON: 02/07/2018 chest radiograph.

Addendum:
EXAM:
OVER-READ INTERPRETATION  CT CHEST

The following report is an over-read performed by radiologist Dr.
Hamlet Ashton [REDACTED] on 05/11/2020. This over-read
does not include interpretation of cardiac or coronary anatomy or
pathology. The coronary CTA interpretation by the cardiologist is
attached.
CLINICAL DATA: This is a 39 year old female with chest pain,
shortness of breath and NSVT.
Cardiac/Coronary  CT
TECHNIQUE: The patient was scanned on a [employer] Force scanner.

[Series 7: best diast 80 % · axial · 0.39mm/px · z∈[-145,-106]mm · 2 of 298 slices shown]
[im 100/298  vessel]
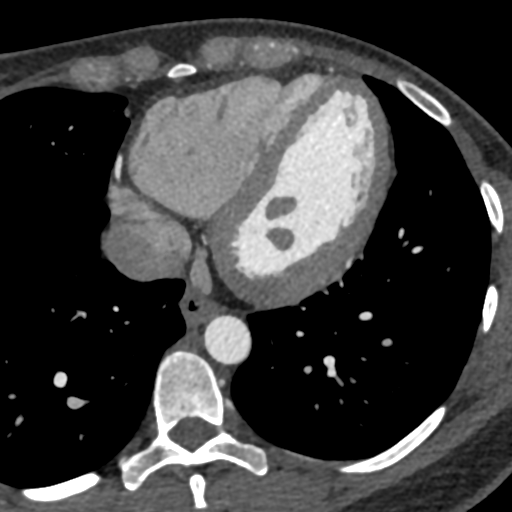
[im 199/298  vessel]
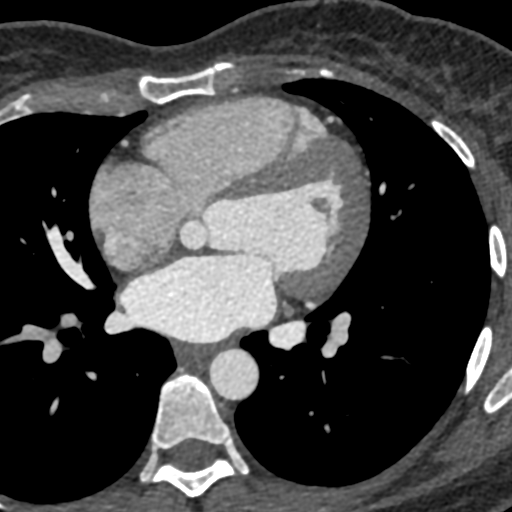

[Series 8: best syst · axial · 0.39mm/px · z∈[-145,-106]mm · 2 of 298 slices shown, 3 images]
[im 100/298  vessel]
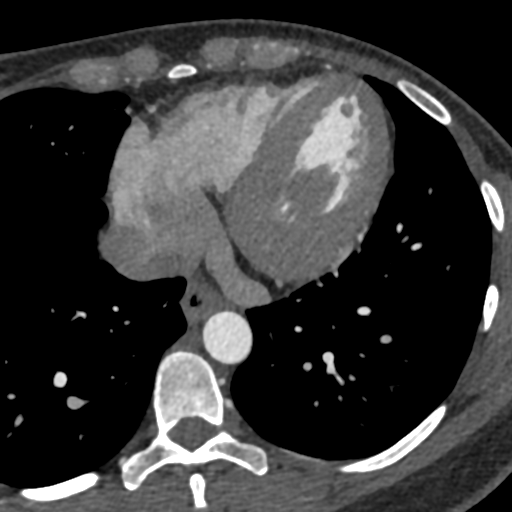
[im 100/298  lung]
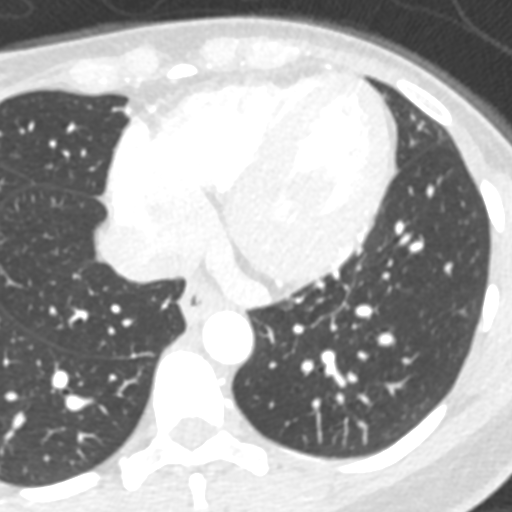
[im 199/298  vessel]
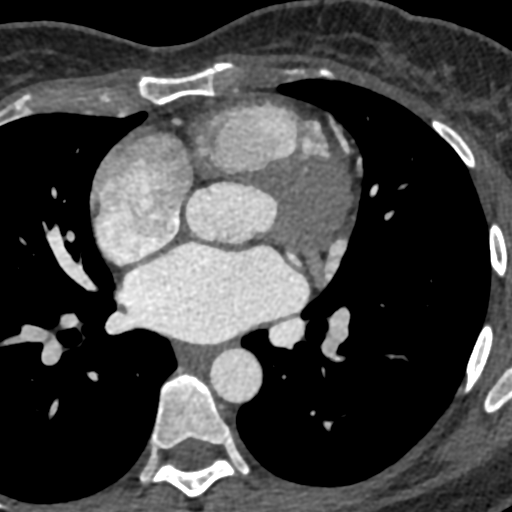

[Series 10: ts diast sharp 80 % · axial · 0.39mm/px · z∈[-145,-106]mm · 2 of 298 slices shown]
[im 100/298  lung]
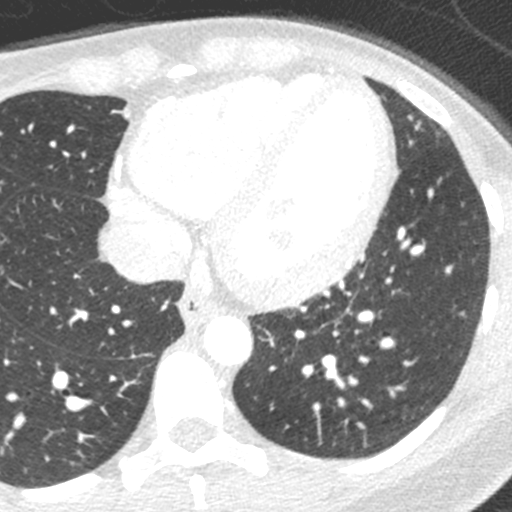
[im 199/298  lung]
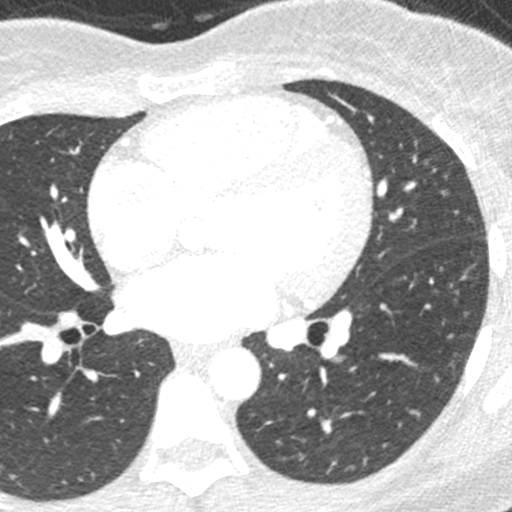

[Series 11: ts syst sharp · axial · 0.39mm/px · z∈[-145,-106]mm · 2 of 298 slices shown]
[im 100/298  lung]
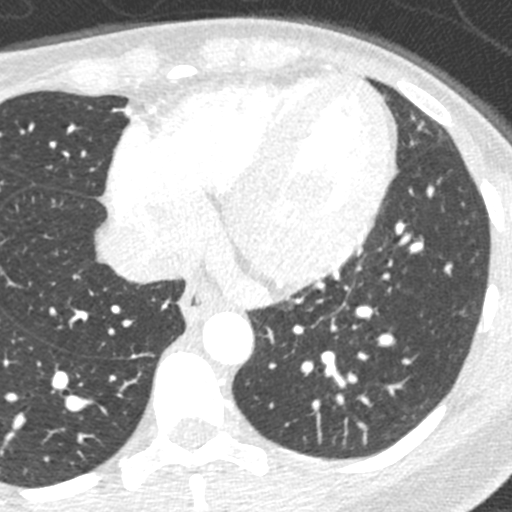
[im 199/298  lung]
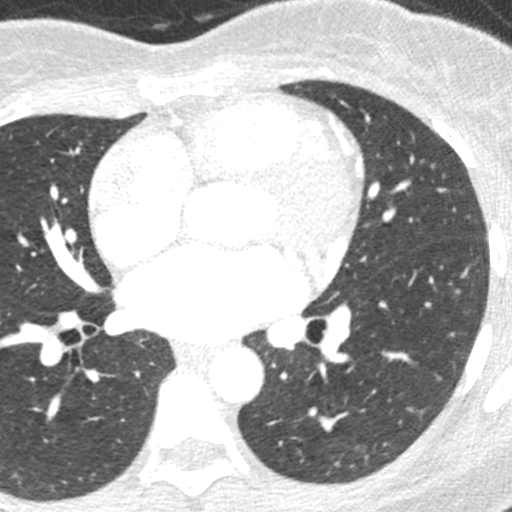

[8 of 20 positions shown; findings below may reference images not displayed]

FINDINGS: Vascular: Aortic atherosclerosis.

Mediastinum/Nodes: No imaged thoracic adenopathy.

Lungs/Pleura: No pleural fluid.  Clear imaged lungs.

Upper Abdomen: Normal imaged portions of the liver, spleen, stomach.

Musculoskeletal: No acute osseous abnormality.
IMPRESSION: 1.  No acute findings in the imaged extracardiac chest.
2.  Aortic Atherosclerosis (XI3K7-Q54.4).
FINDINGS: A 120 kV prospective scan was triggered in the descending thoracic
aorta at 111 HU's. Axial non-contrast 3 mm slices were carried out
through the heart. The data set was analyzed on a dedicated work
station and scored using the Agatson method. Gantry rotation speed
was 250 msecs and collimation was .6 mm. No beta blockade and 0.8 mg
of sl NTG was given. The 3D data set was reconstructed in 5%
intervals of the 67-82 % of the R-R cycle. Diastolic phases were
analyzed on a dedicated work station using MPR, MIP and VRT modes.
The patient received 80 cc of contrast.

Aorta: Normal size.  No calcifications.  No dissection.

Aortic Valve:  Trileaflet.  No calcifications.

Coronary Arteries:  Normal coronary origin.  Right dominance.

RCA is a large dominant artery that gives rise to PDA and PLVB.
There is no plaque.

Left main is a large artery that gives rise to LAD and LCX arteries.

LAD is a large vessel that has no plaque.

LCX is a non-dominant artery that gives rise to one large OM1
branch. There is no plaque.

Other findings:

Normal pulmonary vein drainage into the left atrium.

Normal left atrial appendage without a thrombus.

Normal size of the pulmonary artery.

Small Muscular ventricular septal defect appreciated.
IMPRESSION: 1. Coronary calcium score of 0. This was 0 percentile for age and
sex matched control.

2. Normal coronary origin with right dominance.

3. No evidence of CAD.

4. Small Muscular Ventricular septal defect appreciated.

Jhon Victor Baena, DO

*** End of Addendum ***
EXAM:
OVER-READ INTERPRETATION  CT CHEST

The following report is an over-read performed by radiologist Dr.
Hamlet Ashton [REDACTED] on 05/11/2020. This over-read
does not include interpretation of cardiac or coronary anatomy or
pathology. The coronary CTA interpretation by the cardiologist is
attached.
FINDINGS: Vascular: Aortic atherosclerosis.

Mediastinum/Nodes: No imaged thoracic adenopathy.

Lungs/Pleura: No pleural fluid.  Clear imaged lungs.

Upper Abdomen: Normal imaged portions of the liver, spleen, stomach.

Musculoskeletal: No acute osseous abnormality.
IMPRESSION: 1.  No acute findings in the imaged extracardiac chest.
2.  Aortic Atherosclerosis (XI3K7-Q54.4).

## 2022-08-06 ENCOUNTER — Telehealth: Payer: Self-pay | Admitting: Gastroenterology

## 2022-08-06 NOTE — Telephone Encounter (Signed)
Inbound call from patient, would like prior authorization for Trulance and also would like prescription sent in for Acid reflux.

## 2022-08-07 ENCOUNTER — Other Ambulatory Visit (HOSPITAL_COMMUNITY): Payer: Self-pay

## 2022-08-07 NOTE — Telephone Encounter (Signed)
PA not needed for Trulance, PA authorization good through 01/24/2025

## 2022-08-14 MED ORDER — OMEPRAZOLE 40 MG PO CPDR: 40.0000 mg | DELAYED_RELEASE_CAPSULE | Freq: Every day | ORAL | 4 refills | Status: DC

## 2022-08-14 NOTE — Telephone Encounter (Signed)
Patient transferred to me but didn't say nothing and I waited a minute before ending the calling and asking her multiple times if she can hear me

## 2022-08-14 NOTE — Telephone Encounter (Signed)
Inbound call from patient, calling to follow up on Acid reflux prescription and to see if anything can be done for Trulance, patient states it is now $300 and she cannot afford it. Please advise.

## 2022-08-14 NOTE — Addendum Note (Signed)
Addended by: Lurline Hare on: 08/14/2022 04:46 PM   Modules accepted: Orders

## 2022-08-14 NOTE — Telephone Encounter (Signed)
Patient is returning your call.  

## 2022-08-14 NOTE — Telephone Encounter (Signed)
LVM. According to PA team she doesn't need na PA for trulance. If she can't afford it then she can about a good rx card but we gave her a patient assistance form to her in 2023 so she needs to do the form. Regarding her acid reflux prescription she hasn't been on any within the past 2-3 years according to the ov 02-05-2020. So I need more clarification about this

## 2022-08-14 NOTE — Telephone Encounter (Addendum)
This patient stated that she is running low on her trulance and wanted to know if there is something else to try because it is expensive with insurance? I told her that we can do the patient assistance program that she will come pick up sometimes this week and we don't have any samples for now. And she can try a good rx card as well in the meantime to see if it will lower cost. She says that the trulance seems to not work all the time Regarding miralax she does it as needed.  Regarding acid reflux she said she has been dealing with it and would like something for it. She said she notice that she has symptoms when she is constipated but she was on omeprazole 40mg  in the past so she giot that and made an office visit for 11-29-2022 and was told to call in the meantime if she has any trouble with her Gi issues

## 2022-08-22 NOTE — Telephone Encounter (Signed)
Lets  -Change omeprazole to Protonix 40 mg p.o. daily #30, 6 refills -Must take MiraLAX 17 g once a day -Patient assistance program for Trulance, if possible -Until then, can try Linzess 290 mcg p.o. daily.  Can give samples if you have any or call in a prescription Rest of the plan same as previous note RG

## 2022-08-22 NOTE — Telephone Encounter (Signed)
LVM for patient to call back. ?

## 2022-08-23 MED ORDER — PANTOPRAZOLE SODIUM 40 MG PO TBEC: 40.0000 mg | DELAYED_RELEASE_TABLET | Freq: Every day | ORAL | 6 refills | Status: DC

## 2022-08-23 MED ORDER — LINACLOTIDE 290 MCG PO CAPS: 290.0000 ug | ORAL_CAPSULE | Freq: Every day | ORAL | 5 refills | Status: DC

## 2022-08-23 NOTE — Addendum Note (Signed)
Addended by: Alberteen Sam E on: 08/23/2022 03:24 PM   Modules accepted: Orders

## 2022-08-23 NOTE — Telephone Encounter (Signed)
Patient made aware. Faxed form to [phone] for patient since she said she couldn't come

## 2022-08-23 NOTE — Addendum Note (Signed)
Addended by: Alberteen Sam E on: 08/23/2022 09:03 AM   Modules accepted: Orders

## 2022-08-23 NOTE — Telephone Encounter (Signed)
Change medication to Protonix

## 2022-09-14 ENCOUNTER — Ambulatory Visit (INDEPENDENT_AMBULATORY_CARE_PROVIDER_SITE_OTHER): Payer: BC Managed Care – PPO

## 2022-09-14 ENCOUNTER — Encounter: Payer: Self-pay | Admitting: Family Medicine

## 2022-09-14 ENCOUNTER — Ambulatory Visit: Payer: BC Managed Care – PPO | Admitting: Family Medicine

## 2022-09-14 VITALS — BP 108/76 | HR 64 | Temp 98.1°F | Wt 176.4 lb

## 2022-09-14 DIAGNOSIS — M25512 Pain in left shoulder: Secondary | ICD-10-CM | POA: Diagnosis not present

## 2022-09-14 DIAGNOSIS — R202 Paresthesia of skin: Secondary | ICD-10-CM | POA: Diagnosis not present

## 2022-09-14 DIAGNOSIS — M19019 Primary osteoarthritis, unspecified shoulder: Secondary | ICD-10-CM

## 2022-09-14 DIAGNOSIS — M25511 Pain in right shoulder: Secondary | ICD-10-CM

## 2022-09-14 DIAGNOSIS — D509 Iron deficiency anemia, unspecified: Secondary | ICD-10-CM

## 2022-09-14 DIAGNOSIS — G5601 Carpal tunnel syndrome, right upper limb: Secondary | ICD-10-CM | POA: Diagnosis not present

## 2022-09-14 LAB — VITAMIN B12: Vitamin B-12: 374 pg/mL (ref 211–911)

## 2022-09-14 LAB — CBC WITH DIFFERENTIAL/PLATELET
Basophils Absolute: 0 10*3/uL (ref 0.0–0.1)
Basophils Relative: 1 % (ref 0.0–3.0)
Eosinophils Absolute: 0 10*3/uL (ref 0.0–0.7)
Eosinophils Relative: 1 % (ref 0.0–5.0)
HCT: 33.8 % — ABNORMAL LOW (ref 36.0–46.0)
Hemoglobin: 10.4 g/dL — ABNORMAL LOW (ref 12.0–15.0)
Lymphocytes Relative: 42.5 % (ref 12.0–46.0)
Lymphs Abs: 2 10*3/uL (ref 0.7–4.0)
MCHC: 30.7 g/dL (ref 30.0–36.0)
MCV: 67.6 fl — ABNORMAL LOW (ref 78.0–100.0)
Monocytes Absolute: 0.3 10*3/uL (ref 0.1–1.0)
Monocytes Relative: 6.5 % (ref 3.0–12.0)
Neutro Abs: 2.3 10*3/uL (ref 1.4–7.7)
Neutrophils Relative %: 49 % (ref 43.0–77.0)
Platelets: 298 10*3/uL (ref 150.0–400.0)
RBC: 5.01 Mil/uL (ref 3.87–5.11)
RDW: 16.6 % — ABNORMAL HIGH (ref 11.5–15.5)
WBC: 4.7 10*3/uL (ref 4.0–10.5)

## 2022-09-14 LAB — FOLATE: Folate: 20.6 ng/mL (ref >5.9)

## 2022-09-14 LAB — HEMOGLOBIN A1C: Hgb A1c MFr Bld: 5.7 % (ref 4.6–6.5)

## 2022-09-14 NOTE — Progress Notes (Signed)
Established Patient Office Visit   Subjective  Patient ID: Judy George, female    DOB: Sep 26, 1980  Age: 42 y.o. MRN: [id number]  Chief Complaint  Patient presents with   Pain    Left side pain in shoulder and arm. Mainly at night, when turned over last night it was really painful. Going on for a little over a week.  Tingling/numbness in right hand, going on for a while.    Fatigue    Just been feeling tired lately    Patient is a 42 year old female seen for ongoing concerns.  Patient endorses waking up having to shake R hand due to feeling of numbness and tingling.  Typically occurs at night and occasionally during the day if hand is hanging down.  Patient is right-handed.  Patient endorses to sleep and bending wrist/temperament.  Denies weakness/dropping items.  Bilateral shoulder discomfort left greater than right.  Denies actual pain.  Feels like her arm is sore like after a flu shot.  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  Has not tried anything for symptoms.    Past Medical History:  Diagnosis Date   Alpha+ thalassemia (HCC)    Anemia    Chronic constipation 06/05/2016   Diastasis recti 11/30/2015   History of colon polyps    History of irregular heartbeat 06/05/2016   Kidney stones    Past Surgical History:  Procedure Laterality Date   CESAREAN SECTION     X3   COLONOSCOPY  2016   Dr Judie Petit in [place] 1 removed and it was non cancerous    HERNIA REPAIR  04/2013   TUBAL LIGATION     Social History   Tobacco Use   Smoking status: Never   Smokeless tobacco: Never  Vaping Use   Vaping Use: Never used  Substance Use Topics   Alcohol use: Never   Drug use: Never   Family History  Problem Relation Age of Onset   Hypertension Mother    Colon polyps Mother    Atrial fibrillation Mother    Diabetes Maternal Aunt    Heart attack Maternal Aunt    Colon cancer Neg Hx    Esophageal cancer Neg Hx     No Known Allergies    ROS Negative unless stated above    Objective:     BP 108/76 (BP Location: Left Arm, Patient Position: Sitting, Cuff Size: Large)   Pulse 64   Temp 98.1 F (36.7 C) (Oral)   Wt 176 lb 6.4 oz (80 kg)   SpO2 98%   BMI 32.79 kg/m    Physical Exam Constitutional:      General: She is not in acute distress.    Appearance: Normal appearance.  HENT:     Head: Normocephalic and atraumatic.     Nose: Nose normal.     Mouth/Throat:     Mouth: Mucous membranes are moist.  Cardiovascular:     Rate and Rhythm: Normal rate and regular rhythm.     Heart sounds: No murmur heard.    No gallop.  Pulmonary:     Effort: Pulmonary effort is normal. No respiratory distress.     Breath sounds: No wheezing, rhonchi or rales.  Musculoskeletal:     Comments: +Crossarm.  Negative empty can, Neer's, Hawkins.  No TTP of bilateral shoulders, cervical spine, thoracic spine.  Skin:    General: Skin is warm and dry.  Neurological:  Mental Status: She is alert and oriented to person, place, and time. Mental status is at baseline.     Comments: +tinnel's on R wrist.  Negative Phalen.      No results found for any visits on 09/14/22.    Assessment & Plan:  Paresthesia -     Folate -     Vitamin B12 -     CBC with Differential/Platelet -     Hemoglobin A1c  Acute pain of both shoulders -     DG Shoulder Left; Future  AC joint arthropathy -     CBC with Differential/Platelet -     DG Shoulder Left; Future  Carpal tunnel syndrome of right wrist -     Vitamin B12 -     CBC with Differential/Platelet  Discussed possible causes of paresthesias including vitamin deficiencies, nerve compression.  Will obtain labs to further evaluate.  Also discussed NCS/EMG.  Patient wishes to wait at this time.  Supportive care for wrist including splints at night, stretching exercises, topical analgesics, NSAIDs/Tylenol.  For continued symptoms consider steroids or formal carpal  release.  Shoulder pain likely 2/2 AC joint arthritis.  Discussed modifications to sleep, p.o. and topical analgesics.  Will obtain x-ray to further evaluate.  Return if symptoms worsen or fail to improve.   Deeann Saint, MD

## 2022-09-17 DIAGNOSIS — Z8742 Personal history of other diseases of the female genital tract: Secondary | ICD-10-CM | POA: Insufficient documentation

## 2022-09-19 ENCOUNTER — Telehealth: Payer: Self-pay | Admitting: Family Medicine

## 2022-09-19 NOTE — Telephone Encounter (Signed)
Slightly confused by message.  Please clarify.  Would advised patient not to take spironolactone 25 mg at this time as her BP was low normal during recent office visit.  Patient could develop symptomatic hypotension if starts taking the medication.

## 2022-09-19 NOTE — Telephone Encounter (Signed)
Pt is calling and has spironolactone 25 mg on hand prescribed by her obgyn for PCOS and pt  has checking with her urologist and per pt was told to checking with her PCP causing kidney function and for PCP to monitor her kidney function. Pt would like to know if she should take medication. Please advise

## 2022-09-21 NOTE — Telephone Encounter (Signed)
Correction medication can cause decrease in kidney function

## 2022-09-21 NOTE — Telephone Encounter (Signed)
ATC pt, left detailed VM per FYI, about message from PCP.

## 2022-09-26 ENCOUNTER — Other Ambulatory Visit: Payer: Self-pay

## 2022-09-26 DIAGNOSIS — M25512 Pain in left shoulder: Secondary | ICD-10-CM

## 2022-10-04 ENCOUNTER — Other Ambulatory Visit: Payer: Self-pay | Admitting: Oncology

## 2022-10-04 DIAGNOSIS — D560 Alpha thalassemia: Secondary | ICD-10-CM

## 2022-10-04 NOTE — Progress Notes (Signed)
[facility]  [address] [place],  [place]  [address] [phone]  Clinic Day:  10/05/2022  Referring physician: Deeann Saint, MD   HISTORY OF PRESENT ILLNESS:  The patient is a 42 y.o. female  who I was asked to re-evaluate for microcytic anemia.  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  However, labs in February 2024 showed a low ferritin of 10, a serum iron of 45, a TIBC of 406 and a low iron saturation of 11%.  Her hemoglobin at that time was 10.7.  The patient claims her menstrual cycles have been heavier recently to where she has seen clots within them.  She denies having other overt forms of blood loss.  At the time her alpha thalassemia was diagnosed in May 2018, her hemoglobin was 11.6.   PAST MEDICAL HISTORY:   Past Medical History:  Diagnosis Date   Alpha+ thalassemia (HCC)    Anemia    Chronic constipation 06/05/2016   Diastasis recti 11/30/2015   History of colon polyps    History of irregular heartbeat 06/05/2016   Kidney stones     PAST SURGICAL HISTORY:   Past Surgical History:  Procedure Laterality Date   CESAREAN SECTION     X3   COLONOSCOPY  2016   Dr Judie Petit in [place] 1 removed and it was non cancerous    HERNIA REPAIR  04/2013   TUBAL LIGATION      CURRENT MEDICATIONS:   Current Outpatient Medications  Medication Sig Dispense Refill   FIBER ADULT GUMMIES PO Take by mouth. Take 3 gummies daily     linaclotide (LINZESS) 290 MCG CAPS capsule Take 1 capsule (290 mcg total) by mouth daily before breakfast. 30 capsule 5   pantoprazole (PROTONIX) 40 MG tablet Take 1 tablet (40 mg total) by mouth daily. 30 tablet 6   polyethylene glycol powder (MIRALAX) 17 GM/SCOOP powder Take by mouth daily. (Patient not taking: Reported on 09/14/2022)     tamsulosin (FLOMAX) 0.4 MG CAPS capsule Take 0.4 mg by mouth.     No current facility-administered medications for this visit.    ALLERGIES:  No Known  Allergies  FAMILY HISTORY:   Family History  Problem Relation Age of Onset   Hypertension Mother    Colon polyps Mother    Atrial fibrillation Mother    Diabetes Maternal Aunt    Heart attack Maternal Aunt    Heart disease Maternal Grandfather    Heart attack Maternal Grandfather    Hypertension Maternal Grandfather    Hyperlipidemia Maternal Grandfather    Kidney Stones Maternal Grandmother    Asthma Maternal Grandmother    Hyperlipidemia Maternal Grandmother    Hypertension Maternal Grandmother    Kidney disease Maternal Grandmother    Colon cancer Neg Hx    Esophageal cancer Neg Hx     SOCIAL HISTORY:  The patient was born and raised in [place], [place].  She lives in town with her husband of 22 years.  They have 3 children.  She is currently a Midwife.  There is no history of alcohol or tobacco abuse.    REVIEW OF SYSTEMS:  Review of Systems  Constitutional:  Negative for fatigue and fever.  HENT:   Negative for hearing loss and sore throat.   Eyes:  Negative for eye problems.  Respiratory:  Negative for chest tightness, cough and hemoptysis.   Cardiovascular:  Negative for chest pain and palpitations.  Gastrointestinal:  Negative for abdominal distention, abdominal pain, blood in stool, constipation, diarrhea, nausea and vomiting.  Endocrine: Negative for hot flashes.  Genitourinary:  Negative for difficulty urinating, dysuria, frequency, hematuria and nocturia.   Musculoskeletal:  Negative for arthralgias, back pain, gait problem and myalgias.  Skin: Negative.  Negative for itching and rash.  Neurological: Negative.  Negative for dizziness, extremity weakness, gait problem, headaches, light-headedness and numbness.  Hematological: Negative.   Psychiatric/Behavioral: Negative.  Negative for depression and suicidal ideas. The patient is not nervous/anxious.      PHYSICAL EXAM:  Blood pressure (!) 140/69, pulse 86, temperature 98.4 F (36.9 C), resp. rate 16,  height 5' 1.5" (1.562 m), weight 174 lb 6.4 oz (79.1 kg), SpO2 100%, unknown if currently breastfeeding. Wt Readings from Last 3 Encounters:  10/05/22 174 lb 6.4 oz (79.1 kg)  09/14/22 176 lb 6.4 oz (80 kg)  04/09/22 168 lb 3.2 oz (76.3 kg)   Body mass index is 32.42 kg/m. Performance status (ECOG): 0 - Asymptomatic Physical Exam Constitutional:      Appearance: Normal appearance. She is not ill-appearing.  HENT:     Mouth/Throat:     Mouth: Mucous membranes are moist.     Pharynx: Oropharynx is clear. No oropharyngeal exudate or posterior oropharyngeal erythema.  Cardiovascular:     Rate and Rhythm: Normal rate and regular rhythm.     Heart sounds: No murmur heard.    No friction rub. No gallop.  Pulmonary:     Effort: Pulmonary effort is normal. No respiratory distress.     Breath sounds: Normal breath sounds. No wheezing, rhonchi or rales.  Abdominal:     General: Bowel sounds are normal. There is no distension.     Palpations: Abdomen is soft. There is no mass.     Tenderness: There is no abdominal tenderness.  Musculoskeletal:        General: No swelling.     Right lower leg: No edema.     Left lower leg: No edema.  Lymphadenopathy:     Cervical: No cervical adenopathy.     Upper Body:     Right upper body: No supraclavicular or axillary adenopathy.     Left upper body: No supraclavicular or axillary adenopathy.     Lower Body: No right inguinal adenopathy. No left inguinal adenopathy.  Skin:    General: Skin is warm.     Coloration: Skin is not jaundiced.     Findings: No lesion or rash.  Neurological:     General: No focal deficit present.     Mental Status: She is alert and oriented to person, place, and time. Mental status is at baseline.  Psychiatric:        Mood and Affect: Mood normal.        Behavior: Behavior normal.        Thought Content: Thought content normal.    LABS:       Latest Ref Rng & Units 10/05/2022   12:00 AM 09/14/2022    8:51 AM  05/14/2022    8:03 AM  CBC  WBC  4.0     4.7  4.3   Hemoglobin 12.0 - 16.0 9.9     10.4  10.7   Hematocrit 36 - 46 32     33.8  35.2   Platelets 150 - 400 K/uL 311     298.0  318      This result is from an external source.  Latest Ref Rng & Units 04/09/2022    9:39 AM 04/27/2020    3:26 PM 10/23/2019    9:18 AM  CMP  Glucose 70 - 99 mg/dL 88  161  88   BUN 6 - 23 mg/dL 7  12  7    Creatinine 0.40 - 1.20 mg/dL 0.96  0.45  4.09   Sodium 135 - 145 mEq/L 136  140  140   Potassium 3.5 - 5.1 mEq/L 3.7  4.4  4.8   Chloride 96 - 112 mEq/L 101  101  101   CO2 19 - 32 mEq/L 27  23  24    Calcium 8.4 - 10.5 mg/dL 9.4  9.5  9.3   Total Protein 6.0 - 8.3 g/dL 7.9   7.2   Total Bilirubin 0.2 - 1.2 mg/dL 0.4   0.3   Alkaline Phos 39 - 117 U/L 55   72   AST 0 - 37 U/L 21   18   ALT 0 - 35 U/L 11   9     Latest Reference Range & Units 10/05/22 11:37  Iron 28 - 170 ug/dL 88  UIBC ug/dL 811  TIBC 914 - 782 ug/dL 956 (H)  Saturation Ratios 10.4 - 31.8 % 18  Ferritin 11 - 307 ng/mL 5 (L)  (H): Data is abnormally high (L): Data is abnormally low  ASSESSMENT & PLAN:  A 42 y.o. female with a history of alpha thalassemia minor (@-/@-) as being the reason behind her microcytic anemia.  However, her iron studies today clearly reflect iron deficiency anemia is also present.   Based upon this, I will arrange for her to receive IV iron over these next few weeks to rapidly replenish her iron stores and normalize her hemoglobin.  I will see her back in 3 months to reassess her iron and hemoglobin levels to see how well she has responded to her upcoming IV iron.  The patient understands all the plans discussed today and is in agreement with them.  I do appreciate Deeann Saint, MD for his new consult.   Lawana Hartzell Kirby Funk, MD

## 2022-10-05 ENCOUNTER — Encounter: Payer: Self-pay | Admitting: Oncology

## 2022-10-05 ENCOUNTER — Inpatient Hospital Stay: Payer: BC Managed Care – PPO

## 2022-10-05 ENCOUNTER — Inpatient Hospital Stay: Payer: BC Managed Care – PPO | Attending: Oncology | Admitting: Oncology

## 2022-10-05 ENCOUNTER — Other Ambulatory Visit: Payer: Self-pay | Admitting: Oncology

## 2022-10-05 VITALS — BP 140/69 | HR 86 | Temp 98.4°F | Resp 16 | Ht 61.5 in | Wt 174.4 lb

## 2022-10-05 DIAGNOSIS — D509 Iron deficiency anemia, unspecified: Secondary | ICD-10-CM | POA: Insufficient documentation

## 2022-10-05 DIAGNOSIS — D563 Thalassemia minor: Secondary | ICD-10-CM | POA: Insufficient documentation

## 2022-10-05 DIAGNOSIS — D560 Alpha thalassemia: Secondary | ICD-10-CM | POA: Diagnosis not present

## 2022-10-05 DIAGNOSIS — D500 Iron deficiency anemia secondary to blood loss (chronic): Secondary | ICD-10-CM | POA: Diagnosis not present

## 2022-10-05 LAB — IRON AND TIBC
Iron: 88 ug/dL (ref 28–170)
Saturation Ratios: 18 % (ref 10.4–31.8)
TIBC: 477 ug/dL — ABNORMAL HIGH (ref 250–450)
UIBC: 389 ug/dL

## 2022-10-05 LAB — FERRITIN: Ferritin: 5 ng/mL — ABNORMAL LOW (ref 11–307)

## 2022-10-05 LAB — CBC AND DIFFERENTIAL
HCT: 32 — AB (ref 36–46)
Hemoglobin: 9.9 — AB (ref 12.0–16.0)
Neutrophils Absolute: 1.88
Platelets: 311 10*3/uL (ref 150–400)
WBC: 4

## 2022-10-05 LAB — CBC: RBC: 4.76 (ref 3.87–5.11)

## 2022-10-11 MED FILL — Ferumoxytol Inj 510 MG/17ML (30 MG/ML) (Elemental Fe): INTRAVENOUS | Qty: 17 | Status: AC

## 2022-10-12 ENCOUNTER — Inpatient Hospital Stay: Payer: BC Managed Care – PPO

## 2022-10-12 VITALS — BP 118/67 | HR 91 | Temp 98.0°F | Resp 18 | Ht 61.5 in | Wt 174.0 lb

## 2022-10-12 DIAGNOSIS — D500 Iron deficiency anemia secondary to blood loss (chronic): Secondary | ICD-10-CM

## 2022-10-12 DIAGNOSIS — D509 Iron deficiency anemia, unspecified: Secondary | ICD-10-CM | POA: Diagnosis not present

## 2022-10-12 MED ORDER — SODIUM CHLORIDE 0.9 % IV SOLN: Freq: Once | INTRAVENOUS | Status: AC

## 2022-10-12 MED ORDER — SODIUM CHLORIDE 0.9 % IV SOLN
510.0000 mg | Freq: Once | INTRAVENOUS | Status: AC
  Administered 2022-10-12: 510 mg via INTRAVENOUS
  Filled 2022-10-12: qty 510

## 2022-10-12 NOTE — Patient Instructions (Signed)
Ferumoxytol Injection What is this medication? FERUMOXYTOL (FER ue MOX i tol) treats low levels of iron in your body (iron deficiency anemia). Iron is a mineral that plays an important role in making red blood cells, which carry oxygen from your lungs to the rest of your body. This medicine may be used for other purposes; ask your health care provider or pharmacist if you have questions. COMMON BRAND NAME(S): Feraheme What should I tell my care team before I take this medication? They need to know if you have any of these conditions: Anemia not caused by low iron levels High levels of iron in the blood Magnetic resonance imaging (MRI) test scheduled An unusual or allergic reaction to iron, other medications, foods, dyes, or preservatives Pregnant or trying to get pregnant Breastfeeding How should I use this medication? This medication is injected into a vein. It is given by your care team in a hospital or clinic setting. Talk to your care team the use of this medication in children. Special care may be needed. Overdosage: If you think you have taken too much of this medicine contact a poison control center or emergency room at once. NOTE: This medicine is only for you. Do not share this medicine with others. What if I miss a dose? It is important not to miss your dose. Call your care team if you are unable to keep an appointment. What may interact with this medication? Other iron products This list may not describe all possible interactions. Give your health care provider a list of all the medicines, herbs, non-prescription drugs, or dietary supplements you use. Also tell them if you smoke, drink alcohol, or use illegal drugs. Some items may interact with your medicine. What should I watch for while using this medication? Visit your care team regularly. Tell your care team if your symptoms do not start to get better or if they get worse. You may need blood work done while you are taking this  medication. You may need to follow a special diet. Talk to your care team. Foods that contain iron include: whole grains/cereals, dried fruits, beans, or peas, leafy green vegetables, and organ meats (liver, kidney). What side effects may I notice from receiving this medication? Side effects that you should report to your care team as soon as possible: Allergic reactions--skin rash, itching, hives, swelling of the face, lips, tongue, or throat Low blood pressure--dizziness, feeling faint or lightheaded, blurry vision Shortness of breath Side effects that usually do not require medical attention (report to your care team if they continue or are bothersome): Flushing Headache Joint pain Muscle pain Nausea Pain, redness, or irritation at injection site This list may not describe all possible side effects. Call your doctor for medical advice about side effects. You may report side effects to FDA at 1-800-FDA-1088. Where should I keep my medication? This medication is given in a hospital or clinic. It will not be stored at home. NOTE: This sheet is a summary. It may not cover all possible information. If you have questions about this medicine, talk to your doctor, pharmacist, or health care provider.  2024 Elsevier/Gold Standard (2022-08-10 00:00:00)

## 2022-10-15 MED FILL — Ferumoxytol Inj 510 MG/17ML (30 MG/ML) (Elemental Fe): INTRAVENOUS | Qty: 17 | Status: AC

## 2022-10-16 ENCOUNTER — Inpatient Hospital Stay: Payer: BC Managed Care – PPO

## 2022-10-16 VITALS — BP 108/71 | HR 87 | Temp 98.1°F | Resp 16

## 2022-10-16 DIAGNOSIS — D509 Iron deficiency anemia, unspecified: Secondary | ICD-10-CM | POA: Diagnosis not present

## 2022-10-16 DIAGNOSIS — D500 Iron deficiency anemia secondary to blood loss (chronic): Secondary | ICD-10-CM

## 2022-10-16 MED ORDER — SODIUM CHLORIDE 0.9 % IV SOLN
510.0000 mg | Freq: Once | INTRAVENOUS | Status: AC
  Administered 2022-10-16: 510 mg via INTRAVENOUS
  Filled 2022-10-16: qty 510

## 2022-10-16 MED ORDER — SODIUM CHLORIDE 0.9 % IV SOLN: Freq: Once | INTRAVENOUS | Status: AC

## 2022-10-16 NOTE — Patient Instructions (Signed)
Ferumoxytol Injection What is this medication? FERUMOXYTOL (FER ue MOX i tol) treats low levels of iron in your body (iron deficiency anemia). Iron is a mineral that plays an important role in making red blood cells, which carry oxygen from your lungs to the rest of your body. This medicine may be used for other purposes; ask your health care provider or pharmacist if you have questions. COMMON BRAND NAME(S): Feraheme What should I tell my care team before I take this medication? They need to know if you have any of these conditions: Anemia not caused by low iron levels High levels of iron in the blood Magnetic resonance imaging (MRI) test scheduled An unusual or allergic reaction to iron, other medications, foods, dyes, or preservatives Pregnant or trying to get pregnant Breastfeeding How should I use this medication? This medication is injected into a vein. It is given by your care team in a hospital or clinic setting. Talk to your care team the use of this medication in children. Special care may be needed. Overdosage: If you think you have taken too much of this medicine contact a poison control center or emergency room at once. NOTE: This medicine is only for you. Do not share this medicine with others. What if I miss a dose? It is important not to miss your dose. Call your care team if you are unable to keep an appointment. What may interact with this medication? Other iron products This list may not describe all possible interactions. Give your health care provider a list of all the medicines, herbs, non-prescription drugs, or dietary supplements you use. Also tell them if you smoke, drink alcohol, or use illegal drugs. Some items may interact with your medicine. What should I watch for while using this medication? Visit your care team regularly. Tell your care team if your symptoms do not start to get better or if they get worse. You may need blood work done while you are taking this  medication. You may need to follow a special diet. Talk to your care team. Foods that contain iron include: whole grains/cereals, dried fruits, beans, or peas, leafy green vegetables, and organ meats (liver, kidney). What side effects may I notice from receiving this medication? Side effects that you should report to your care team as soon as possible: Allergic reactions--skin rash, itching, hives, swelling of the face, lips, tongue, or throat Low blood pressure--dizziness, feeling faint or lightheaded, blurry vision Shortness of breath Side effects that usually do not require medical attention (report to your care team if they continue or are bothersome): Flushing Headache Joint pain Muscle pain Nausea Pain, redness, or irritation at injection site This list may not describe all possible side effects. Call your doctor for medical advice about side effects. You may report side effects to FDA at 1-800-FDA-1088. Where should I keep my medication? This medication is given in a hospital or clinic. It will not be stored at home. NOTE: This sheet is a summary. It may not cover all possible information. If you have questions about this medicine, talk to your doctor, pharmacist, or health care provider.  2024 Elsevier/Gold Standard (2022-08-10 00:00:00)

## 2022-10-18 ENCOUNTER — Telehealth: Payer: Self-pay | Admitting: Family Medicine

## 2022-10-18 NOTE — Telephone Encounter (Signed)
Pt made hew own appt to [facility] and they need a copy of xray of left shoulder phone number is [phone]. Pt was seen today . Pt does not have their fax number

## 2022-11-29 ENCOUNTER — Ambulatory Visit: Payer: BC Managed Care – PPO | Admitting: Gastroenterology

## 2022-12-17 ENCOUNTER — Ambulatory Visit: Payer: BC Managed Care – PPO | Admitting: Cardiology

## 2023-01-08 ENCOUNTER — Other Ambulatory Visit: Payer: BC Managed Care – PPO

## 2023-01-08 ENCOUNTER — Ambulatory Visit: Payer: BC Managed Care – PPO | Admitting: Oncology

## 2023-01-16 NOTE — Progress Notes (Unsigned)
[facility]  [address] [place],  [place]  [address] [phone]  Clinic Day:  01/17/2023  Referring physician: Deeann Saint, MD   HISTORY OF PRESENT ILLNESS:  The patient is a 42 y.o. female  who microcytic anemia secondary to both iron deficiency anemia and alpha thalassemia minor (@-/@-).  Due to recent labs showing iron deficiency, she was given IV iron in July 2024.  She comes in to reassess her iron and hemoglobin levels.  Overall, she claims to have felt better after receiving her IV iron.  However, she has felt weaker recently.  She claims her menstrual cycles have not been particularly heavy.  She denies having other overt forms of blood loss.  At the time her alpha thalassemia was diagnosed in May 2018, her hemoglobin was 11.6.   PHYSICAL EXAM:  Blood pressure 133/83, pulse 72, temperature 99.1 F (37.3 C), resp. rate 14, height 5' 1.5" (1.562 m), weight 176 lb 14.4 oz (80.2 kg), SpO2 100%, unknown if currently breastfeeding. Wt Readings from Last 3 Encounters:  01/17/23 176 lb 14.4 oz (80.2 kg)  10/12/22 174 lb (78.9 kg)  10/05/22 174 lb 6.4 oz (79.1 kg)   Body mass index is 32.88 kg/m. Performance status (ECOG): 0 - Asymptomatic Physical Exam Constitutional:      Appearance: Normal appearance. She is not ill-appearing.  HENT:     Mouth/Throat:     Mouth: Mucous membranes are moist.     Pharynx: Oropharynx is clear. No oropharyngeal exudate or posterior oropharyngeal erythema.  Cardiovascular:     Rate and Rhythm: Normal rate and regular rhythm.     Heart sounds: No murmur heard.    No friction rub. No gallop.  Pulmonary:     Effort: Pulmonary effort is normal. No respiratory distress.     Breath sounds: Normal breath sounds. No wheezing, rhonchi or rales.  Abdominal:     General: Bowel sounds are normal. There is no distension.     Palpations: Abdomen is soft. There is no mass.     Tenderness: There is no abdominal  tenderness.  Musculoskeletal:        General: No swelling.     Right lower leg: No edema.     Left lower leg: No edema.  Lymphadenopathy:     Cervical: No cervical adenopathy.     Upper Body:     Right upper body: No supraclavicular or axillary adenopathy.     Left upper body: No supraclavicular or axillary adenopathy.     Lower Body: No right inguinal adenopathy. No left inguinal adenopathy.  Skin:    General: Skin is warm.     Coloration: Skin is not jaundiced.     Findings: No lesion or rash.  Neurological:     General: No focal deficit present.     Mental Status: She is alert and oriented to person, place, and time. Mental status is at baseline.  Psychiatric:        Mood and Affect: Mood normal.        Behavior: Behavior normal.        Thought Content: Thought content normal.    LABS:      Latest Ref Rng & Units 01/17/2023   12:00 AM 10/05/2022   12:00 AM 09/14/2022    8:51 AM  CBC  WBC  6.7     4.0     4.7   Hemoglobin 12.0 - 16.0 11.3     9.9  10.4   Hematocrit 36 - 46 36     32     33.8   Platelets 150 - 400 K/uL 256     311     298.0      This result is from an external source.      Latest Ref Rng & Units 04/09/2022    9:39 AM 04/27/2020    3:26 PM 10/23/2019    9:18 AM  CMP  Glucose 70 - 99 mg/dL 88  829  88   BUN 6 - 23 mg/dL 7  12  7    Creatinine 0.40 - 1.20 mg/dL 5.62  1.30  8.65   Sodium 135 - 145 mEq/L 136  140  140   Potassium 3.5 - 5.1 mEq/L 3.7  4.4  4.8   Chloride 96 - 112 mEq/L 101  101  101   CO2 19 - 32 mEq/L 27  23  24    Calcium 8.4 - 10.5 mg/dL 9.4  9.5  9.3   Total Protein 6.0 - 8.3 g/dL 7.9   7.2   Total Bilirubin 0.2 - 1.2 mg/dL 0.4   0.3   Alkaline Phos 39 - 117 U/L 55   72   AST 0 - 37 U/L 21   18   ALT 0 - 35 U/L 11   9     Latest Reference Range & Units 10/05/22 11:37 01/17/23 14:15  Iron 28 - 170 ug/dL 88 82  UIBC ug/dL 784 696  TIBC 295 - 284 ug/dL 132 (H) 440  Saturation Ratios 10.4 - 31.8 % 18 22  Ferritin 11 - 307 ng/mL 5  (L) 38  (H): Data is abnormally high (L): Data is abnormally low  ASSESSMENT & PLAN:  A 42 y.o. female with alpha thalassemia minor (@-/@-) and iron deficiency anemia.  Her hemoglobin and iron levels have significantly improved since she received her IV iron.  As she does have alpha thalassemia, her hemoglobin will always be slightly low.  Overall, she appears to be doing well.  I will see her back in 6 months for repeat clinical assessment.  The patient understands all the plans discussed today and is in agreement with them.  Ileta Ofarrell Kirby Funk, MD

## 2023-01-17 ENCOUNTER — Inpatient Hospital Stay: Payer: BC Managed Care – PPO | Attending: Oncology

## 2023-01-17 ENCOUNTER — Inpatient Hospital Stay: Payer: BC Managed Care – PPO | Admitting: Oncology

## 2023-01-17 ENCOUNTER — Other Ambulatory Visit: Payer: Self-pay | Admitting: Oncology

## 2023-01-17 VITALS — BP 133/83 | HR 72 | Temp 99.1°F | Resp 14 | Ht 61.5 in | Wt 176.9 lb

## 2023-01-17 DIAGNOSIS — D500 Iron deficiency anemia secondary to blood loss (chronic): Secondary | ICD-10-CM

## 2023-01-17 DIAGNOSIS — D563 Thalassemia minor: Secondary | ICD-10-CM | POA: Insufficient documentation

## 2023-01-17 DIAGNOSIS — D509 Iron deficiency anemia, unspecified: Secondary | ICD-10-CM | POA: Diagnosis not present

## 2023-01-17 LAB — CBC AND DIFFERENTIAL
HCT: 36 (ref 36–46)
Hemoglobin: 11.3 — AB (ref 12.0–16.0)
Neutrophils Absolute: 4.22
Platelets: 256 10*3/uL (ref 150–400)
WBC: 6.7

## 2023-01-17 LAB — IRON AND TIBC
Iron: 82 ug/dL (ref 28–170)
Saturation Ratios: 22 % (ref 10.4–31.8)
TIBC: 378 ug/dL (ref 250–450)
UIBC: 296 ug/dL

## 2023-01-17 LAB — CBC: RBC: 4.98 (ref 3.87–5.11)

## 2023-01-17 LAB — FERRITIN: Ferritin: 38 ng/mL (ref 11–307)

## 2023-01-28 ENCOUNTER — Telehealth: Payer: Self-pay | Admitting: Family Medicine

## 2023-01-28 DIAGNOSIS — Z1239 Encounter for other screening for malignant neoplasm of breast: Secondary | ICD-10-CM

## 2023-01-28 NOTE — Telephone Encounter (Signed)
Requesting referral for ultrasound, experiencing breast discomfort. Has dense breasts. Requesting referral to [facility] in [place] (p) [phone]

## 2023-02-01 NOTE — Telephone Encounter (Signed)
Can place order for diagnostic mammogram.

## 2023-02-04 NOTE — Addendum Note (Signed)
Addended by: Philipp Deputy A on: 02/04/2023 02:00 PM   Modules accepted: Orders

## 2023-02-04 NOTE — Telephone Encounter (Signed)
Order has been placed. Patient was called VM left to inform patient.

## 2023-02-04 NOTE — Telephone Encounter (Signed)
Pt would like a call, once referral has been placed.

## 2023-02-05 ENCOUNTER — Telehealth: Payer: Self-pay | Admitting: Family Medicine

## 2023-02-05 NOTE — Telephone Encounter (Signed)
Pt requesting order for mammogram [facility] [place]  phone [phone]

## 2023-02-07 ENCOUNTER — Ambulatory Visit: Payer: BC Managed Care – PPO | Admitting: Family Medicine

## 2023-02-07 ENCOUNTER — Encounter: Payer: Self-pay | Admitting: Family Medicine

## 2023-02-07 VITALS — BP 110/70 | HR 90 | Temp 98.1°F | Ht 61.5 in | Wt 174.2 lb

## 2023-02-07 DIAGNOSIS — Z87442 Personal history of urinary calculi: Secondary | ICD-10-CM | POA: Diagnosis not present

## 2023-02-07 DIAGNOSIS — Z1231 Encounter for screening mammogram for malignant neoplasm of breast: Secondary | ICD-10-CM

## 2023-02-07 DIAGNOSIS — R5381 Other malaise: Secondary | ICD-10-CM

## 2023-02-07 DIAGNOSIS — R9230 Dense breasts, unspecified: Secondary | ICD-10-CM

## 2023-02-07 DIAGNOSIS — R319 Hematuria, unspecified: Secondary | ICD-10-CM | POA: Diagnosis not present

## 2023-02-07 DIAGNOSIS — M545 Low back pain, unspecified: Secondary | ICD-10-CM

## 2023-02-07 DIAGNOSIS — R253 Fasciculation: Secondary | ICD-10-CM | POA: Diagnosis not present

## 2023-02-07 DIAGNOSIS — D563 Thalassemia minor: Secondary | ICD-10-CM

## 2023-02-07 DIAGNOSIS — R202 Paresthesia of skin: Secondary | ICD-10-CM

## 2023-02-07 DIAGNOSIS — R600 Localized edema: Secondary | ICD-10-CM

## 2023-02-07 LAB — POC URINALSYSI DIPSTICK (AUTOMATED)
Bilirubin, UA: NEGATIVE
Glucose, UA: NEGATIVE
Ketones, UA: NEGATIVE
Leukocytes, UA: NEGATIVE
Nitrite, UA: NEGATIVE
Protein, UA: NEGATIVE
Spec Grav, UA: <=1.005 — AB (ref 1.010–1.025)
Urobilinogen, UA: 0.2 U/dL
pH, UA: 6 (ref 5.0–8.0)

## 2023-02-07 LAB — CBC WITH DIFFERENTIAL/PLATELET
Basophils Absolute: 0 10*3/uL (ref 0.0–0.1)
Basophils Relative: 1.2 % (ref 0.0–3.0)
Eosinophils Absolute: 0 10*3/uL (ref 0.0–0.7)
Eosinophils Relative: 1.3 % (ref 0.0–5.0)
HCT: 39.2 % (ref 36.0–46.0)
Hemoglobin: 12.1 g/dL (ref 12.0–15.0)
Lymphocytes Relative: 40.4 % (ref 12.0–46.0)
Lymphs Abs: 1.5 10*3/uL (ref 0.7–4.0)
MCHC: 30.9 g/dL (ref 30.0–36.0)
MCV: 73.3 fL — ABNORMAL LOW (ref 78.0–100.0)
Monocytes Absolute: 0.3 10*3/uL (ref 0.1–1.0)
Monocytes Relative: 8.7 % (ref 3.0–12.0)
Neutro Abs: 1.8 10*3/uL (ref 1.4–7.7)
Neutrophils Relative %: 48.4 % (ref 43.0–77.0)
Platelets: 320 10*3/uL (ref 150.0–400.0)
RBC: 5.36 Mil/uL — ABNORMAL HIGH (ref 3.87–5.11)
RDW: 15.3 % (ref 11.5–15.5)
WBC: 3.7 10*3/uL — ABNORMAL LOW (ref 4.0–10.5)

## 2023-02-07 LAB — COMPREHENSIVE METABOLIC PANEL
ALT: 10 U/L (ref 0–35)
AST: 17 U/L (ref 0–37)
Albumin: 4.2 g/dL (ref 3.5–5.2)
Alkaline Phosphatase: 59 U/L (ref 39–117)
BUN: 4 mg/dL — ABNORMAL LOW (ref 6–23)
CO2: 28 meq/L (ref 19–32)
Calcium: 9.3 mg/dL (ref 8.4–10.5)
Chloride: 103 meq/L (ref 96–112)
Creatinine, Ser: 0.63 mg/dL (ref 0.40–1.20)
GFR: 107.85 mL/min (ref >60.00)
Glucose, Bld: 86 mg/dL (ref 70–99)
Potassium: 4.7 meq/L (ref 3.5–5.1)
Sodium: 139 meq/L (ref 135–145)
Total Bilirubin: 0.5 mg/dL (ref 0.2–1.2)
Total Protein: 7.3 g/dL (ref 6.0–8.3)

## 2023-02-07 LAB — FOLATE: Folate: 11.2 ng/mL (ref >5.9)

## 2023-02-07 LAB — URINALYSIS, ROUTINE W REFLEX MICROSCOPIC
Bilirubin Urine: NEGATIVE
Ketones, ur: NEGATIVE
Leukocytes,Ua: NEGATIVE
Nitrite: NEGATIVE
Specific Gravity, Urine: 1.01 (ref 1.000–1.030)
Total Protein, Urine: NEGATIVE
Urine Glucose: NEGATIVE
Urobilinogen, UA: 0.2 (ref 0.0–1.0)
WBC, UA: NONE SEEN (ref 0–?)
pH: 7.5 (ref 5.0–8.0)

## 2023-02-07 LAB — VITAMIN B12: Vitamin B-12: 312 pg/mL (ref 211–911)

## 2023-02-07 LAB — C-REACTIVE PROTEIN: CRP: <1 mg/dL (ref 0.5–20.0)

## 2023-02-07 LAB — MAGNESIUM: Magnesium: 1.8 mg/dL (ref 1.5–2.5)

## 2023-02-07 NOTE — Patient Instructions (Signed)
Orders for labs were placed.  Your mammogram orders were also redone.  You should expect a phone call about setting up this appointment.

## 2023-02-07 NOTE — Progress Notes (Signed)
Not helped some  Established Patient Office Visit   Subjective  Patient ID: Judy George, female    DOB: 21-Dec-1980  Age: 42 y.o. MRN: [id number]  Chief Complaint  Patient presents with   Numbness    Patient came in today for tingling and numbness in the legs feet hands and face, started 2 years ago, at that time patient was seen by Neuro, they concluded that the patient didn't have MS,    Leg Pain    Right lower leg pain   Back Pain    Patient has a history of kidney stones, patient has been having lower back pain for the last 3 days, URO told the patient to ask for a CMP     Patient is a 42 year old female seen for ongoing concerns.  Patient endorses numbness, tingling in feet, face, and hands times a few weeks.  Patient states she had similar episodes several years ago and was seen by neurology.  Advised at the time that she did not have MS.  Patient also notes a general unwell/sick feeling.  Has pain in the right calf and edema.  Also notes fasciculations in right foot.  Also notes bilateral low back pain that started a few days ago.  Has history of renal calculi status post lithotripsy.  Called urology but was advised to see PCP.  Patient denies fever, chills, nausea, vomiting.  Wants to make sure nothing serious is going on.  Patient notes history of heavy menses due to thalassemia.  Currently on menses.  Currently taking iron supplements.  History of chronic constipation.  On Linzess.  Unsure if med is working well.  Patient requesting order for mammogram be sent to [facility] center as she lives in [place].  States also gets ultrasound with mammogram due to history of dense breast tissue.    Patient Active Problem List   Diagnosis Date Noted   Iron deficiency anemia due to chronic blood loss 10/05/2022   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   History of colon polyps    Alpha+ thalassemia (HCC)     Abdominal pain 10/23/2019   Thalassemia minor 07/12/2019   Class 1 obesity due to excess calories without serious comorbidity with body mass index (BMI) of 30.0 to 30.9 in adult 06/04/2019   Chest discomfort 02/26/2018   Kidney stones    Anemia    Chronic constipation 06/05/2016   History of irregular heartbeat 06/05/2016   Diastasis recti 11/30/2015   Past Medical History:  Diagnosis Date   Alpha+ thalassemia (HCC)    Anemia    Chronic constipation 06/05/2016   Diastasis recti 11/30/2015   History of colon polyps    History of irregular heartbeat 06/05/2016   Kidney stones    Past Surgical History:  Procedure Laterality Date   CESAREAN SECTION     X3   COLONOSCOPY  2016   Dr Judie Petit in [place] 1 removed and it was non cancerous    HERNIA REPAIR  04/2013   TUBAL LIGATION     Social History   Tobacco Use   Smoking status: Never   Smokeless tobacco: Never  Vaping Use   Vaping status: Never Used  Substance Use Topics   Alcohol use: Never   Drug use: Never   Family History  Problem Relation Age of Onset   Hypertension Mother    Colon polyps Mother    Atrial fibrillation Mother    Diabetes Maternal Aunt  Heart attack Maternal Aunt    Heart disease Maternal Grandfather    Heart attack Maternal Grandfather    Hypertension Maternal Grandfather    Hyperlipidemia Maternal Grandfather    Kidney Stones Maternal Grandmother    Asthma Maternal Grandmother    Hyperlipidemia Maternal Grandmother    Hypertension Maternal Grandmother    Kidney disease Maternal Grandmother    Colon cancer Neg Hx    Esophageal cancer Neg Hx    No Known Allergies    ROS Negative unless stated above    Objective:     BP 110/70 (BP Location: Left Arm, Patient Position: Sitting, Cuff Size: Large)   Pulse 90   Temp 98.1 F (36.7 C) (Oral)   Ht 5' 1.5" (1.562 m)   Wt 174 lb 3.2 oz (79 kg)   LMP 01/26/2023 (Exact Date)   SpO2 99%   Breastfeeding No   BMI 32.38 kg/m  BP Readings from  Last 3 Encounters:  02/07/23 110/70  01/17/23 133/83  10/16/22 108/71   Wt Readings from Last 3 Encounters:  02/07/23 174 lb 3.2 oz (79 kg)  01/17/23 176 lb 14.4 oz (80.2 kg)  10/12/22 174 lb (78.9 kg)      Physical Exam Constitutional:      General: She is not in acute distress.    Appearance: Normal appearance.  HENT:     Head: Normocephalic and atraumatic.     Nose: Nose normal.     Mouth/Throat:     Mouth: Mucous membranes are moist.  Eyes:     Extraocular Movements: Extraocular movements intact.     Conjunctiva/sclera: Conjunctivae normal.  Cardiovascular:     Rate and Rhythm: Normal rate and regular rhythm.     Heart sounds: Normal heart sounds. No murmur heard.    No gallop.  Pulmonary:     Effort: Pulmonary effort is normal. No respiratory distress.     Breath sounds: Normal breath sounds. No wheezing, rhonchi or rales.  Musculoskeletal:     Cervical back: Normal.     Thoracic back: Normal.     Lumbar back: Tenderness present. No bony tenderness.       Back:     Right lower leg: No tenderness. Edema present.     Left lower leg: Normal. No tenderness.  Skin:    General: Skin is warm and dry.  Neurological:     Mental Status: She is alert and oriented to person, place, and time.     Comments: Negative Tinel and Phalen bilateral upper extremities.  No paresthesias with tapping on condos.      Results for orders placed or performed in visit on 02/07/23  POCT Urinalysis Dipstick (Automated)  Result Value Ref Range   Color, UA yellow    Clarity, UA clear    Glucose, UA Negative Negative   Bilirubin, UA neg    Ketones, UA neg    Spec Grav, UA <=1.005 (A) 1.010 - 1.025   Blood, UA 3+    pH, UA 6.0 5.0 - 8.0   Protein, UA Negative Negative   Urobilinogen, UA 0.2 0.2 or 1.0 E.U./dL   Nitrite, UA neg    Leukocytes, UA Negative Negative      02/07/2023    8:51 AM 04/09/2022    9:04 AM 07/07/2019    3:32 PM  Depression screen PHQ 2/9  Decreased Interest  0 0 0  Down, Depressed, Hopeless 0 0 0  PHQ - 2 Score 0 0 0  Altered sleeping  0 0   Tired, decreased energy 1 0   Change in appetite 0 0   Feeling bad or failure about yourself  0 0   Trouble concentrating 0 0   Moving slowly or fidgety/restless 0 0   Suicidal thoughts 0 0   PHQ-9 Score 1 0   Difficult doing work/chores Not difficult at all Not difficult at all       02/07/2023    8:51 AM  GAD 7 : Generalized Anxiety Score  Nervous, Anxious, on Edge 0  Control/stop worrying 0  Worry too much - different things 0  Trouble relaxing 0  Restless 0  Easily annoyed or irritable 0  Afraid - awful might happen 0  Total GAD 7 Score 0  Anxiety Difficulty Not difficult at all        Assessment & Plan:  Acute low back pain without sciatica, unspecified back pain laterality -     POCT Urinalysis Dipstick (Automated) -     Urinalysis, Routine w reflex microscopic  Paresthesia -     CBC with Differential/Platelet -     Vitamin B12 -     Folate  History of renal calculi -     Urinalysis, Routine w reflex microscopic  Malaise -     CBC with Differential/Platelet -     C-reactive protein  Hematuria, unspecified type -     Urine Culture -     Urinalysis, Routine w reflex microscopic -     CBC with Differential/Platelet  Fasciculations -     Magnesium -     Comprehensive metabolic panel  Edema of right lower extremity -     Comprehensive metabolic panel -     D-dimer, quantitative  Thalassemia minor -     CBC with Differential/Platelet  Encounter for screening mammogram for malignant neoplasm of breast -     3D Screening Mammogram, Left and Right; Future -     [place] LIMITED ULTRASOUND INCLUDING AXILLA RIGHT BREAST; Future -     [place] LIMITED ULTRASOUND INCLUDING AXILLA LEFT BREAST ; Future  Obtain POC UA for low back pain and history of renal calculi.  3+ blood noted on UA, otherwise negative.  RBCs likely due to ongoing menses versus renal calculi.  Will obtain urine  culture and microscopic urinalysis.  Patient encouraged to increase p.o. intake of water and fluids.  Further recommendations based on culture results.  Will obtain labs to evaluate other symptoms including paresthesias, malaise, fasciculations.  Advised anemia could be worsened due to current menses.  Also evaluate for electrolyte or vitamin deficiency.  Referral to neurology if needed based on results.  Given strict precautions.  Return in about 4 weeks (around 03/07/2023), or if symptoms worsen or fail to improve.   Deeann Saint, MD

## 2023-02-08 LAB — URINE CULTURE
MICRO NUMBER:: 15763024
Result:: NO GROWTH
SPECIMEN QUALITY:: ADEQUATE

## 2023-02-08 LAB — D-DIMER, QUANTITATIVE: D-Dimer, Quant: 0.47 ug{FEU}/mL (ref <0.50)

## 2023-02-08 NOTE — Telephone Encounter (Signed)
Pt does not want to go to [facility] she would like to go breast center in [place] phone [phone]

## 2023-02-11 ENCOUNTER — Telehealth: Payer: Self-pay | Admitting: Family Medicine

## 2023-02-11 NOTE — Telephone Encounter (Signed)
[facility] [phone]) called to say because of Pt's age, the referral should be for a Bilateral Diagnostic Mammogram. Also, they cannot use the code of Z-1231. They are asking that referral be amended.

## 2023-02-12 NOTE — Telephone Encounter (Signed)
Pt calling to check on progress of this request. Info is needed in order for them to schedule her

## 2023-02-12 NOTE — Telephone Encounter (Signed)
Updated orders faxed.

## 2023-02-12 NOTE — Addendum Note (Signed)
Addended by: Kathreen Devoid on: 02/12/2023 04:29 PM   Modules accepted: Orders

## 2023-02-18 ENCOUNTER — Encounter: Payer: Self-pay | Admitting: Gastroenterology

## 2023-02-18 ENCOUNTER — Ambulatory Visit: Payer: BC Managed Care – PPO | Admitting: Gastroenterology

## 2023-02-18 ENCOUNTER — Telehealth: Payer: Self-pay | Admitting: Family Medicine

## 2023-02-18 VITALS — BP 118/80 | HR 79 | Ht 61.5 in | Wt 178.0 lb

## 2023-02-18 DIAGNOSIS — K219 Gastro-esophageal reflux disease without esophagitis: Secondary | ICD-10-CM

## 2023-02-18 DIAGNOSIS — R0789 Other chest pain: Secondary | ICD-10-CM | POA: Diagnosis not present

## 2023-02-18 DIAGNOSIS — K581 Irritable bowel syndrome with constipation: Secondary | ICD-10-CM

## 2023-02-18 DIAGNOSIS — D509 Iron deficiency anemia, unspecified: Secondary | ICD-10-CM

## 2023-02-18 DIAGNOSIS — Z83719 Family history of colon polyps, unspecified: Secondary | ICD-10-CM

## 2023-02-18 NOTE — Progress Notes (Signed)
Chief Complaint: FU  Referring Provider:  Deeann Saint, MD      ASSESSMENT AND PLAN;   #1. IBS-C. Neg colon 01/2019. Nl TSH 05/2016 #2. IDA with neg EGD with SB bx, neg colon 01/2019, neg CT AP 10/2019. (Thal trait in past per Dr Melvyn Neth) #3. GERD with NCCP.  Neg cardiac work-up. #4. FH colon polyps (mom < 50)   Plan: - Miralax 17g po qd with fiber/Linzess to continue for now. - Trulance 3mg  po qd #90. She needs pre-auth.  - Motegrity 2mg  po every day (samples given)  - Increase water intake - Walking 30 min/day.  - Recall Colon July 2025 (she would like to get it done in summer due to her schedule)    HPI:    Judy George is a 42 y.o. 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  Unfortunately, her INS has stopped covering Trulance.  We switched back to Linzess 248mcg/MiraLAX combination.  Working but not as good.  Has been having 2-3 BMs per week.  Has associated abdominal bloating, generalized abdominal discomfort which gets better with Bms.  No melena or hematochezia.  Has failed multiple medications including over-the-counter fiber, twice daily MiraLAX, Linzess 290 mcg, 145 mcg and 72 mcg.  Denies having any nausea, vomiting, heartburn, regurgitation odynophagia or dysphagia.  No weight loss or loss of appetite.  No melena or hematochezia.  Had kidney stones with "kidney infection" Evaluated at [facility] October 2023 Underwent noncontrast CT Abdo/pelvis which did show constipation along with extensive bilateral nephrolithiasis without obstruction.  From previous notes Longstanding history of the patient ever since first pregnancy with daughter 15 years ago.  She does pass pellet-like stools, associated lower abdominal cramps which gets better with defecation, abdominal bloating.  If she does not take any laxatives, she would have 1 bowel movement in 1 to 2 weeks.  She does try to drink plenty of  water.  Has tried multiple medications including MiraLAX which did not work very well.  Linzess 290 works off and on.  Has tried herbal tea as well.  Denies nonsteroidal use.  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  Past GI procedures: Colonoscopy 01/23/2019  -Small internal hemorrhoids. -Otherwise normal colonoscopy to TI. The colon was highly redundant. -Repeat in 5 years d/t FH of polyps.  Earlier, if with any new problems  EGD 01/23/2019  -Minimal gastritis -Otherwise normal EGD. -Neg SB bx and gastric biopsies  CT Abdo/pelvis with p.o. and IV contrast 10/2019 -Antral thickening -No acute abnormalities -S/P ventral hernia repair.  No recurrent hernias.  NCCT Jan 15, 2022 1. Extensive bilateral nephrolithiasis but no evidence of acute obstructive uropathy. 2. Redundant large bowel with substantially increased retained stool since 12/24/2021. Query constipation. 3. Small volume of simple density free fluid in the pelvis is probably physiologic.   -had colonoscopy performed by Dr. Jennye Boroughs ?  2015/2016-had one colonic polyp removed.  Mom also had polyps. Past Medical History:  Diagnosis Date   Alpha+ thalassemia (HCC)    Anemia    Chronic constipation 06/05/2016   Diastasis recti 11/30/2015   History of colon polyps    History of irregular heartbeat 06/05/2016  Kidney stones     Past Surgical History:  Procedure Laterality Date   CESAREAN SECTION     X3   COLONOSCOPY  2016   Dr Judie Petit in [place] 1 removed and it was non cancerous    HERNIA REPAIR  04/2013   TUBAL LIGATION      Family History  Problem Relation Age of Onset   Hypertension Mother    Colon polyps Mother    Atrial fibrillation Mother    Diabetes Maternal Aunt    Heart attack Maternal  Aunt    Heart disease Maternal Grandfather    Heart attack Maternal Grandfather    Hypertension Maternal Grandfather    Hyperlipidemia Maternal Grandfather    Kidney Stones Maternal Grandmother    Asthma Maternal Grandmother    Hyperlipidemia Maternal Grandmother    Hypertension Maternal Grandmother    Kidney disease Maternal Grandmother    Colon cancer Neg Hx    Esophageal cancer Neg Hx     Social History   Tobacco Use   Smoking status: Never   Smokeless tobacco: Never  Vaping Use   Vaping status: Never Used  Substance Use Topics   Alcohol use: Never   Drug use: Never    Current Outpatient Medications  Medication Sig Dispense Refill   FIBER ADULT GUMMIES PO Take by mouth. Take 3 gummies daily     linaclotide (LINZESS) 290 MCG CAPS capsule Take 1 capsule (290 mcg total) by mouth daily before breakfast. 30 capsule 5   pantoprazole (PROTONIX) 40 MG tablet Take 1 tablet (40 mg total) by mouth daily. 30 tablet 6   polyethylene glycol powder (MIRALAX) 17 GM/SCOOP powder Take by mouth daily.     No current facility-administered medications for this visit.    No Known Allergies  Review of Systems:  neg     Physical Exam:    BP 118/80   Pulse 79   Ht 5' 1.5" (1.562 m)   Wt 178 lb (80.7 kg)   LMP 01/26/2023 (Exact Date)   BMI 33.09 kg/m  Filed Weights   02/18/23 1537  Weight: 178 lb (80.7 kg)   Constitutional:  Well-developed, in no acute distress. Psychiatric: Normal mood and affect. Behavior is normal. HEENT: Pupils normal.  Conjunctivae are normal. No scleral icterus. Cardiovascular: Normal rate, regular rhythm. No edema Pulmonary/chest: Effort normal and breath sounds normal. No wheezing, rales or rhonchi. Abdominal: Soft, nondistended. Nontender. Bowel sounds active throughout. There are no masses palpable. No hepatomegaly.  Rectal diastases.  No hernias. Neurological: Alert and oriented to person place and time. Skin: Skin is warm and dry. No rashes  noted.    Edman Circle, MD 02/18/2023, 3:58 PM  Cc: Deeann Saint, MD

## 2023-02-18 NOTE — Patient Instructions (Addendum)
_______________________________________________________  If your blood pressure at your visit was 140/90 or greater, please contact your primary care physician to follow up on this.  _______________________________________________________  If you are age 44 or older, your body mass index should be between 23-30. Your Body mass index is 33.09 kg/m. If this is out of the aforementioned range listed, please consider follow up with your Primary Care Provider.  If you are age 71 or younger, your body mass index should be between 19-25. Your Body mass index is 33.09 kg/m. If this is out of the aformentioned range listed, please consider follow up with your Primary Care Provider.   ________________________________________________________  The [facility] providers would like to encourage you to use [facility] to communicate with providers for non-urgent requests or questions.  Due to long hold times on the telephone, sending your provider a message by [facility] may be a faster and more efficient way to get a response.  Please allow 48 business hours for a response.  Please remember that this is for non-urgent requests.  _______________________________________________________  Continue current medications  We have given you samples of the following medication to take: Motegrity 1 tablet daily please start and call us in a week regarding the medication  Please increase water intake Start walking 30 minutes a day  Repeat colonoscopy for 09-2023. Please call 2 months prior to schedule this. A letter will be sent as it gets closer. You can call 3 months prior to see if we have a schedule out because it will get full quickly   Thank you,  Dr. Lynann Bologna

## 2023-02-18 NOTE — Telephone Encounter (Signed)
Pt is calling and would like someone to go over her blood work from 02-07-2023 pt states her WBC is low

## 2023-03-05 NOTE — Addendum Note (Signed)
Addended by: Weyman Croon E on: 03/05/2023 03:03 PM   Modules accepted: Orders

## 2023-03-06 ENCOUNTER — Encounter: Payer: Self-pay | Admitting: Family Medicine

## 2023-03-06 LAB — HM MAMMOGRAPHY

## 2023-03-08 ENCOUNTER — Telehealth: Payer: Self-pay

## 2023-03-08 NOTE — Telephone Encounter (Signed)
Copied from CRM [id number]. Topic: Clinical - Request for Lab/Test Order >> Mar 08, 2023 10:58 AM Sonny Dandy B wrote: Reason for CRM: Missing order for diagnostic mammo gram  order to faxed

## 2023-03-08 NOTE — Telephone Encounter (Signed)
Faxed  x5

## 2023-03-11 NOTE — Progress Notes (Unsigned)
New Patient Office Visit  Subjective    Patient ID: Judy George, female    DOB: 1980/04/12  Age: 42 y.o. MRN: [id number]  CC: No chief complaint on file.   HPI Judy George presents to establish care. Prior PCP was *** at ***. Last physical was ***. she notes that she requires refills of ***.  Alpha thalassemia-   Chronic Constipation-  Screenings:  Colon Cancer: *** Lung Cancer: *** Breast Cancer: *** Diabetes: *** Ophthalmology*** Foot*** HLD: ***  The 10-year ASCVD risk score (Arnett DK, et al., 2019) is: 0.4%  Acute Problems: ***  Outpatient Encounter Medications as of 03/14/2023  Medication Sig   FIBER ADULT GUMMIES PO Take by mouth. Take 3 gummies daily   linaclotide (LINZESS) 290 MCG CAPS capsule Take 1 capsule (290 mcg total) by mouth daily before breakfast.   pantoprazole (PROTONIX) 40 MG tablet Take 1 tablet (40 mg total) by mouth daily.   polyethylene glycol powder (MIRALAX) 17 GM/SCOOP powder Take by mouth daily.   No facility-administered encounter medications on file as of 03/14/2023.    Past Medical History:  Diagnosis Date   Alpha+ thalassemia (HCC)    Anemia    Chronic constipation 06/05/2016   Diastasis recti 11/30/2015   History of colon polyps    History of irregular heartbeat 06/05/2016   Kidney stones     Past Surgical History:  Procedure Laterality Date   CESAREAN SECTION     X3   COLONOSCOPY  2016   Dr Judy George in [place] 1 removed and it was non cancerous    HERNIA REPAIR  04/2013   TUBAL LIGATION      Family History  Problem Relation Age of Onset   Hypertension Mother    Colon polyps Mother    Atrial fibrillation Mother    Diabetes Maternal Aunt    Heart attack Maternal Aunt    Heart disease Maternal Grandfather    Heart attack Maternal Grandfather    Hypertension Maternal Grandfather    Hyperlipidemia Maternal Grandfather    Kidney Stones Maternal Grandmother    Asthma Maternal Grandmother    Hyperlipidemia  Maternal Grandmother    Hypertension Maternal Grandmother    Kidney disease Maternal Grandmother    Colon cancer Neg Hx    Esophageal cancer Neg Hx     Social History   Socioeconomic History   Marital status: Married    Spouse name: Aneta Mins   Number of children: 4   Years of education: Not on file   Highest education level: Bachelor's degree (e.g., BA, AB, BS)  Occupational History   Occupation: Dentist  Tobacco Use   Smoking status: Never   Smokeless tobacco: Never  Vaping Use   Vaping status: Never Used  Substance and Sexual Activity   Alcohol use: Never   Drug use: Never   Sexual activity: Yes    Partners: Male    Birth control/protection: Surgical  Other Topics Concern   Not on file  Social History Narrative   Lives at home with husband and 4 children   R handed   Caffeine: 1 C of coffee a day   Social Drivers of Corporate investment banker Strain: Low Risk  (09/13/2022)   Overall Financial Resource Strain (CARDIA)    Difficulty of Paying Living Expenses: Not hard at all  Food Insecurity: No Food Insecurity (09/13/2022)   Hunger Vital Sign    Worried About Running Out of Food in the Last Year:  Never true    Ran Out of Food in the Last Year: Never true  Transportation Needs: No Transportation Needs (09/13/2022)   PRAPARE - Administrator, Civil Service (Medical): No    Lack of Transportation (Non-Medical): No  Physical Activity: Unknown (09/13/2022)   Exercise Vital Sign    Days of Exercise per Week: 0 days    Minutes of Exercise per Session: Not on file  Stress: No Stress Concern Present (09/13/2022)   [employer] of Occupational Health - Occupational Stress Questionnaire    Feeling of Stress : Not at all  Social Connections: Socially Integrated (09/13/2022)   Social Connection and Isolation Panel [NHANES]    Frequency of Communication with Friends and Family: More than three times a week    Frequency of Social Gatherings with  Friends and Family: More than three times a week    Attends Religious Services: More than 4 times per year    Active Member of [employer] or Organizations: Yes    Attends Engineer, structural: More than 4 times per year    Marital Status: Married  Catering manager Violence: Not on file    ROS  Per HPI      Objective    There were no vitals taken for this visit.  Physical Exam  {Labs (Optional):23779}    Assessment & Plan:   There are no diagnoses linked to this encounter.  No follow-ups on file.   Judy Lucy Gwenlyn Hottinger, PA-C

## 2023-03-14 ENCOUNTER — Encounter (HOSPITAL_BASED_OUTPATIENT_CLINIC_OR_DEPARTMENT_OTHER): Payer: Self-pay | Admitting: Student

## 2023-03-14 ENCOUNTER — Ambulatory Visit (HOSPITAL_BASED_OUTPATIENT_CLINIC_OR_DEPARTMENT_OTHER): Payer: BC Managed Care – PPO | Admitting: Student

## 2023-03-14 VITALS — BP 133/74 | HR 69 | Temp 98.7°F | Ht 62.01 in | Wt 175.7 lb

## 2023-03-14 DIAGNOSIS — Z87442 Personal history of urinary calculi: Secondary | ICD-10-CM | POA: Insufficient documentation

## 2023-03-14 DIAGNOSIS — R8271 Bacteriuria: Secondary | ICD-10-CM

## 2023-03-14 DIAGNOSIS — R600 Localized edema: Secondary | ICD-10-CM | POA: Insufficient documentation

## 2023-03-14 DIAGNOSIS — D563 Thalassemia minor: Secondary | ICD-10-CM

## 2023-03-14 DIAGNOSIS — N179 Acute kidney failure, unspecified: Secondary | ICD-10-CM | POA: Insufficient documentation

## 2023-03-14 DIAGNOSIS — D72819 Decreased white blood cell count, unspecified: Secondary | ICD-10-CM | POA: Insufficient documentation

## 2023-03-14 DIAGNOSIS — N939 Abnormal uterine and vaginal bleeding, unspecified: Secondary | ICD-10-CM | POA: Insufficient documentation

## 2023-03-14 DIAGNOSIS — Z7689 Persons encountering health services in other specified circumstances: Secondary | ICD-10-CM

## 2023-03-14 DIAGNOSIS — N83201 Unspecified ovarian cyst, right side: Secondary | ICD-10-CM | POA: Insufficient documentation

## 2023-03-14 DIAGNOSIS — R202 Paresthesia of skin: Secondary | ICD-10-CM | POA: Insufficient documentation

## 2023-03-14 DIAGNOSIS — B9689 Other specified bacterial agents as the cause of diseases classified elsewhere: Secondary | ICD-10-CM | POA: Insufficient documentation

## 2023-03-14 DIAGNOSIS — N200 Calculus of kidney: Secondary | ICD-10-CM | POA: Insufficient documentation

## 2023-03-14 DIAGNOSIS — J45909 Unspecified asthma, uncomplicated: Secondary | ICD-10-CM | POA: Insufficient documentation

## 2023-03-14 DIAGNOSIS — A419 Sepsis, unspecified organism: Secondary | ICD-10-CM | POA: Insufficient documentation

## 2023-03-14 DIAGNOSIS — N760 Acute vaginitis: Secondary | ICD-10-CM

## 2023-03-14 DIAGNOSIS — K5909 Other constipation: Secondary | ICD-10-CM

## 2023-03-14 HISTORY — DX: Acute kidney failure, unspecified: N17.9

## 2023-03-14 HISTORY — DX: Bacteriuria: R82.71

## 2023-03-14 HISTORY — DX: Other specified bacterial agents as the cause of diseases classified elsewhere: N76.0

## 2023-03-14 NOTE — Assessment & Plan Note (Addendum)
Last B12 was within low normal levels- small portion of patients can exhibit symptoms with low normal levels of B12  Via shared decision making we have opted to do a trial of B12 supplementation. Associated with paresthesias-possibility of right-sided Baker's cyst with pain behind the knee, no noted trauma, negative D-dimer, and unilateral swelling.

## 2023-03-14 NOTE — Assessment & Plan Note (Signed)
Sees urology, recent imaging has indicated that there are stable kidney stones unmoved since prior imaging.  History of lithotripsy.

## 2023-03-14 NOTE — Assessment & Plan Note (Signed)
IBS-see, sees Dr. Chales Abrahams with GI.  Stable-continue to monitor.

## 2023-03-14 NOTE — Assessment & Plan Note (Addendum)
Sees Dr. Melvyn Neth at the [facility] cancer portion.  Gets IV iron which seems to be helping.  Last CBC was stable-continue to monitor.  Ordered CBC today predominantly to assess low white blood cells for the sake of patient.

## 2023-03-14 NOTE — Patient Instructions (Signed)
It was nice to see you today!  As we discussed in clinic we will be rechecking a CBC today in order to check your white blood cell count.  As we discussed, I believe that this is likely a lab abnormality that is not of concern for now but we will continue to follow the trend.  For the recurrent "tingling "I would like for you to begin B12 supplementation which you should be able to find over-the-counter at any pharmacy-you may start with 1000 mcg daily.  Please let us know if anything major changes or if you have any immediate concerns before your next visit.  If you have any problems before your next visit feel free to message me via MyChart (minor issues or questions) or call the office, otherwise you may reach out to schedule an office visit.  Thank you! Gerilyn Pilgrim Lilliah Priego, PA-C

## 2023-03-14 NOTE — Assessment & Plan Note (Signed)
Associated with paresthesias-possibility of right-sided Baker's cyst? with pain behind the knee, no noted trauma, negative D-dimer, and unilateral swelling. Currently stable-will continue to monitor.

## 2023-03-15 LAB — CBC WITH DIFFERENTIAL/PLATELET
Basophils Absolute: 0 10*3/uL (ref 0.0–0.2)
Basos: 0 %
EOS (ABSOLUTE): 0 10*3/uL (ref 0.0–0.4)
Eos: 0 %
Hematocrit: 40.4 % (ref 34.0–46.6)
Hemoglobin: 12 g/dL (ref 11.1–15.9)
Immature Grans (Abs): 0 10*3/uL (ref 0.0–0.1)
Immature Granulocytes: 0 %
Lymphocytes Absolute: 1.8 10*3/uL (ref 0.7–3.1)
Lymphs: 30 %
MCH: 22.8 pg — ABNORMAL LOW (ref 26.6–33.0)
MCHC: 29.7 g/dL — ABNORMAL LOW (ref 31.5–35.7)
MCV: 77 fL — ABNORMAL LOW (ref 79–97)
Monocytes Absolute: 0.3 10*3/uL (ref 0.1–0.9)
Monocytes: 5 %
Neutrophils Absolute: 4 10*3/uL (ref 1.4–7.0)
Neutrophils: 65 %
Platelets: 300 10*3/uL (ref 150–450)
RBC: 5.27 x10E6/uL (ref 3.77–5.28)
RDW: 13.7 % (ref 11.7–15.4)
WBC: 6.1 10*3/uL (ref 3.4–10.8)

## 2023-03-16 ENCOUNTER — Encounter (HOSPITAL_BASED_OUTPATIENT_CLINIC_OR_DEPARTMENT_OTHER): Payer: Self-pay | Admitting: Emergency Medicine

## 2023-03-16 ENCOUNTER — Ambulatory Visit (HOSPITAL_BASED_OUTPATIENT_CLINIC_OR_DEPARTMENT_OTHER)
Admission: EM | Admit: 2023-03-16 | Discharge: 2023-03-16 | Disposition: A | Discharge status: Home / Self Care | Payer: BC Managed Care – PPO | Attending: Internal Medicine | Admitting: Internal Medicine

## 2023-03-16 DIAGNOSIS — R202 Paresthesia of skin: Secondary | ICD-10-CM

## 2023-03-16 MED ORDER — PREDNISONE 10 MG (21) PO TBPK: ORAL_TABLET | ORAL | 0 refills | Status: DC

## 2023-03-16 NOTE — ED Provider Notes (Addendum)
[facility]    CSN: [id number] Arrival date & time: 03/16/23  9562      History   Chief Complaint No chief complaint on file.   HPI Judy George is a 42 y.o. female.   HPI Has been having numb tingly sensations right lower leg for several months.  States the symptoms come and go seem to be more noticeable when she sitting still, relieve with ambulation. She has  concerns for lyme, she was bitten by an unknown insect in October on her right thigh, still has a brown mark in that area.   She has had some intermittent low back pain lumbar area without known back injury. Has been having some right knee pain, left shoulder pain, left arm numbness and tingling.  She has been seen by an orthopedist, had x-rays of her knee.  States she was told she has some carpal tunnel and was given various treatment options.  She is scheduled to see the orthopedist again in February. Denies fever, chills, sweats, lower extremity weakness, urinary symptoms, incontinence.  Past Medical History:  Diagnosis Date   AKI (acute kidney injury) (HCC) 03/14/2023   Alpha+ thalassemia (HCC)    Anemia    Asymptomatic bacteriuria 03/14/2023   BV (bacterial vaginosis) 03/14/2023   Chronic constipation 06/05/2016   Diastasis recti 11/30/2015   History of colon polyps    History of irregular heartbeat 06/05/2016   Kidney stones    Ovarian cyst    right    Patient Active Problem List   Diagnosis Date Noted   Calculus of kidney 03/14/2023   Leukopenia 03/14/2023   Reactive airway disease 03/14/2023   Right ovarian cyst 03/14/2023   Vaginal bleeding 03/14/2023   Paresthesia 03/14/2023   Hx of renal calculi 03/14/2023   Leg edema, right 03/14/2023   Iron deficiency anemia due to chronic blood loss 10/05/2022   History of PCOS 09/17/2022   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   History of colon polyps    Alpha+ thalassemia (HCC)     Abdominal pain 10/23/2019   Thalassemia minor 07/12/2019   Class 1 obesity due to excess calories without serious comorbidity with body mass index (BMI) of 30.0 to 30.9 in adult 06/04/2019   Chest discomfort 02/26/2018   Anemia    Chronic constipation 06/05/2016   History of irregular heartbeat 06/05/2016   Ventral hernia 11/30/2015    Past Surgical History:  Procedure Laterality Date   CESAREAN SECTION     X3   COLONOSCOPY  2016   Dr Judie Petit in [place] 1 removed and it was non cancerous    HERNIA REPAIR  04/2013   TUBAL LIGATION      OB History     Gravida  1   Para      Term      Preterm      AB      Living         SAB      IAB      Ectopic      Multiple      Live Births               Home Medications    Prior to Admission medications   Medication Sig Start Date End Date Taking? Authorizing Provider  predniSONE (STERAPRED UNI-PAK 21 TAB) 10 MG (21) TBPK tablet Take as directed 03/16/23  Yes Nautika Cressey, Judy Land, PA  FIBER ADULT GUMMIES PO Take  by mouth. Take 3 gummies daily    [provider]  linaclotide (LINZESS) 290 MCG CAPS capsule Take 1 capsule (290 mcg total) by mouth daily before breakfast. 08/23/22   Lynann Bologna, MD  pantoprazole (PROTONIX) 40 MG tablet Take 1 tablet (40 mg total) by mouth daily. Patient taking differently: Take 40 mg by mouth as needed. 08/23/22   Lynann Bologna, MD  polyethylene glycol powder [facility]) 17 GM/SCOOP powder Take by mouth daily.    [provider]    Family History Family History  Problem Relation Age of Onset   Hypertension Mother    Colon polyps Mother    Atrial fibrillation Mother        surgery   Diabetes Maternal Aunt    Heart attack Maternal Aunt    Kidney Stones Maternal Grandmother    Asthma Maternal Grandmother    Hyperlipidemia Maternal Grandmother    Hypertension Maternal Grandmother    Kidney disease Maternal Grandmother    Heart disease Maternal Grandfather    Heart attack  Maternal Grandfather    Hypertension Maternal Grandfather    Hyperlipidemia Maternal Grandfather    Colon cancer Neg Hx    Esophageal cancer Neg Hx     Social History Social History   Tobacco Use   Smoking status: Never    Passive exposure: Never   Smokeless tobacco: Never  Vaping Use   Vaping status: Never Used  Substance Use Topics   Alcohol use: Never   Drug use: Never     Allergies   Patient has no known allergies.   Review of Systems Review of Systems   Physical Exam Triage Vital Signs ED Triage Vitals  Encounter Vitals Group     BP 03/16/23 0828 118/82     Systolic BP Percentile --      Diastolic BP Percentile --      Pulse Rate 03/16/23 0828 88     Resp 03/16/23 0828 16     Temp 03/16/23 0828 (!) 97.3 F (36.3 C)     Temp Source 03/16/23 0828 Oral     SpO2 03/16/23 0828 99 %     Weight --      Height --      Head Circumference --      Peak Flow --      Pain Score 03/16/23 0827 6     Pain Loc --      Pain Education --      Exclude from Growth Chart --    No data found.  Updated Vital Signs BP 118/82 (BP Location: Left Arm)   Pulse 88   Temp (!) 97.3 F (36.3 C) (Oral)   Resp 16   LMP 02/12/2023 (Approximate)   SpO2 99%   Visual Acuity Right Eye Distance:   Left Eye Distance:   Bilateral Distance:    Right Eye Near:   Left Eye Near:    Bilateral Near:     Physical Exam Vitals and nursing note reviewed.  Constitutional:      Appearance: She is not ill-appearing.  Cardiovascular:     Rate and Rhythm: Normal rate.     Heart sounds: Normal heart sounds.  Pulmonary:     Effort: Pulmonary effort is normal.     Breath sounds: Normal breath sounds.  Musculoskeletal:     Lumbar back: No bony tenderness. Negative right straight leg raise test and negative left straight leg raise test.  Skin:    General: Skin is warm and  dry.     Comments: 1 cm area hyperpigmented skin right lateral lower thigh without induration, edema or tenderness   Neurological:     Mental Status: She is alert and oriented to person, place, and time.     Motor: No weakness.     Gait: Gait normal.     Deep Tendon Reflexes: Reflexes normal.      UC Treatments / Results  Labs (all labs ordered are listed, but only abnormal results are displayed) Labs Reviewed  LYME DISEASE SEROLOGY W/REFLEX    EKG   Radiology No results found.  Procedures Procedures (including critical care time)  Medications Ordered in UC Medications - No data to display  Initial Impression / Assessment and Plan / UC Course  I have reviewed the triage vital signs and the nursing notes.  Pertinent labs & imaging results that were available during my care of the patient were reviewed by me and considered in my medical decision making (see chart for details).     Patient is mostly concerned about Lyme.  Discussed with patient we will send a Lyme titer. Discussed with patient her symptoms may be radicular pain will treat with short course of steroids.  She should follow-up with her primary care doctor or her orthopedist.  Staff was unable to do the blood draw here in the office, recommend patient follow-up with her PCP who can order the Lyme titer. Final Clinical Impressions(s) / UC Diagnoses   Final diagnoses:  Paresthesia of right lower extremity     Discharge Instructions      Test results will be released to your MyChart account We will contact you if anything is positive and requires treatment.   Follow-up with your primary care doctor or orthopedist   ED Prescriptions     Medication Sig Dispense Auth. Provider   predniSONE (STERAPRED UNI-PAK 21 TAB) 10 MG (21) TBPK tablet Take as directed 21 tablet Meliton Rattan, PA      PDMP not reviewed this encounter.   Meliton Rattan, PA 03/16/23 0849    Meliton Rattan, PA 03/16/23 [id number]

## 2023-03-16 NOTE — ED Triage Notes (Signed)
Pt c/o numbness and tingling in right leg from knee down was on a field trip at the end of October  and was bitten by something and still has a discolored area above right knee. Pt seen Ortho on Monday and they did xray and said she had some arthritis but nothing injured.

## 2023-03-16 NOTE — Discharge Instructions (Signed)
Test results will be released to your MyChart account We will contact you if anything is positive and requires treatment.   Follow-up with your primary care doctor or orthopedist

## 2023-03-18 ENCOUNTER — Encounter: Payer: Self-pay | Admitting: Oncology

## 2023-04-04 ENCOUNTER — Ambulatory Visit (HOSPITAL_BASED_OUTPATIENT_CLINIC_OR_DEPARTMENT_OTHER)
Admission: EM | Admit: 2023-04-04 | Discharge: 2023-04-04 | Disposition: A | Discharge status: Home / Self Care | Payer: 59 | Attending: Family Medicine | Admitting: Family Medicine

## 2023-04-04 ENCOUNTER — Encounter: Payer: Self-pay | Admitting: Oncology

## 2023-04-04 ENCOUNTER — Encounter (HOSPITAL_BASED_OUTPATIENT_CLINIC_OR_DEPARTMENT_OTHER): Payer: Self-pay | Admitting: Emergency Medicine

## 2023-04-04 DIAGNOSIS — R051 Acute cough: Secondary | ICD-10-CM

## 2023-04-04 DIAGNOSIS — J069 Acute upper respiratory infection, unspecified: Secondary | ICD-10-CM | POA: Diagnosis not present

## 2023-04-04 DIAGNOSIS — S70361D Insect bite (nonvenomous), right thigh, subsequent encounter: Secondary | ICD-10-CM

## 2023-04-04 DIAGNOSIS — W57XXXD Bitten or stung by nonvenomous insect and other nonvenomous arthropods, subsequent encounter: Secondary | ICD-10-CM

## 2023-04-04 DIAGNOSIS — R52 Pain, unspecified: Secondary | ICD-10-CM | POA: Diagnosis not present

## 2023-04-04 LAB — POC COVID19/FLU A&B COMBO
Covid Antigen, POC: NEGATIVE
Influenza A Antigen, POC: NEGATIVE
Influenza B Antigen, POC: NEGATIVE

## 2023-04-04 NOTE — Discharge Instructions (Addendum)
Regarding insect bite: Thighs and legs measure close to the same size with minimal variation.  Bite site is healing well and no sign of persistent bull's-eye rash or Lyme rash.  Lyme titer drawn and sent.  If titer is abnormal we will contact the patient.  If titer is normal results will be available on the portal.  Rapid flu and COVID were negative.  Symptoms are most consistent with a viral illness.  Get plenty of fluids.  Get plenty of rest.  Use acetaminophen or ibuprofen, as needed, every 4 hours, for fever or bodyaches.  Return here if symptoms do not improve, worsen, or new symptoms occur.

## 2023-04-04 NOTE — ED Provider Notes (Signed)
[facility]    CSN: [id number] Arrival date & time: 04/04/23  1234      History   Chief Complaint Chief Complaint  Patient presents with   Cough   Nasal Congestion    HPI Judy George is a 43 y.o. female.   Here with report of cough and congestion with bodyaches for 24 to 36 hours.  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  She has felt worse in the last 24 hours.  She is concerned that she has the flu.  She also had a some kind of insect bite on her right thigh in late December.  She was seen here at this urgent care on 03/16/2023.  After 3 attempts the staff were unable to get blood.  She was being tested for Lyme disease.  She did not ever gotten back to primary care to get a Lyme titer done.  She wanted me to check the bite site, determine if her right leg is swollen, and get the blood work.   Cough Associated symptoms: no chest pain, no chills, no ear pain, no fever, no rash, no rhinorrhea, no shortness of breath and no sore throat     Past Medical History:  Diagnosis Date   AKI (acute kidney injury) (HCC) 03/14/2023   Alpha+ thalassemia (HCC)    Anemia    Asymptomatic bacteriuria 03/14/2023   BV (bacterial vaginosis) 03/14/2023   Chronic constipation 06/05/2016   Diastasis recti 11/30/2015   History of colon polyps    History of irregular heartbeat 06/05/2016   Kidney stones    Ovarian cyst    right    Patient Active Problem List   Diagnosis Date Noted   Calculus of kidney 03/14/2023   Leukopenia 03/14/2023   Reactive airway disease 03/14/2023   Right ovarian cyst 03/14/2023   Vaginal bleeding 03/14/2023   Paresthesia 03/14/2023   Hx of renal calculi 03/14/2023   Leg edema, right 03/14/2023   Iron deficiency anemia due to chronic blood loss 10/05/2022   History of PCOS 09/17/2022   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   Elevated lipoprotein(a)  04/09/2022   History of colon polyps    Alpha+ thalassemia (HCC)    Abdominal pain 10/23/2019   Thalassemia minor 07/12/2019   Class 1 obesity due to excess calories without serious comorbidity with body mass index (BMI) of 30.0 to 30.9 in adult 06/04/2019   Chest discomfort 02/26/2018   Anemia    Chronic constipation 06/05/2016   History of irregular heartbeat 06/05/2016   Ventral hernia 11/30/2015    Past Surgical History:  Procedure Laterality Date   CESAREAN SECTION     X3   COLONOSCOPY  2016   Dr Judie Petit in [place] 1 removed and it was non cancerous    HERNIA REPAIR  04/2013   TUBAL LIGATION      OB History     Gravida  1   Para      Term      Preterm      AB      Living         SAB      IAB      Ectopic      Multiple      Live Births               Home Medications  Prior to Admission medications   Medication Sig Start Date End Date Taking? Authorizing Provider  FIBER ADULT GUMMIES PO Take by mouth. Take 3 gummies daily    [provider]  linaclotide (LINZESS) 290 MCG CAPS capsule Take 1 capsule (290 mcg total) by mouth daily before breakfast. 08/23/22   Lynann Bologna, MD  pantoprazole (PROTONIX) 40 MG tablet Take 1 tablet (40 mg total) by mouth daily. Patient taking differently: Take 40 mg by mouth as needed. 08/23/22   Lynann Bologna, MD  polyethylene glycol powder [facility]) 17 GM/SCOOP powder Take by mouth daily.    [provider]  predniSONE (STERAPRED UNI-PAK 21 TAB) 10 MG (21) TBPK tablet Take as directed 03/16/23   Meliton Rattan, PA    Family History Family History  Problem Relation Age of Onset   Hypertension Mother    Colon polyps Mother    Atrial fibrillation Mother        surgery   Diabetes Maternal Aunt    Heart attack Maternal Aunt    Kidney Stones Maternal Grandmother    Asthma Maternal Grandmother    Hyperlipidemia Maternal Grandmother    Hypertension Maternal Grandmother    Kidney disease Maternal  Grandmother    Heart disease Maternal Grandfather    Heart attack Maternal Grandfather    Hypertension Maternal Grandfather    Hyperlipidemia Maternal Grandfather    Colon cancer Neg Hx    Esophageal cancer Neg Hx     Social History Social History   Tobacco Use   Smoking status: Never    Passive exposure: Never   Smokeless tobacco: Never  Vaping Use   Vaping status: Never Used  Substance Use Topics   Alcohol use: Never   Drug use: Never     Allergies   Patient has no known allergies.   Review of Systems Review of Systems  Constitutional:  Negative for chills and fever.  HENT:  Positive for congestion. Negative for ear pain, nosebleeds, postnasal drip, rhinorrhea, sinus pain and sore throat.   Eyes:  Negative for pain and visual disturbance.  Respiratory:  Positive for cough. Negative for shortness of breath.   Cardiovascular:  Negative for chest pain and palpitations.  Gastrointestinal:  Negative for abdominal pain, constipation, diarrhea, nausea and vomiting.  Genitourinary:  Negative for dysuria and hematuria.  Musculoskeletal:  Positive for arthralgias. Negative for back pain.  Skin:  Positive for wound (Healing insect bite on right anterior thigh above the knee.  No erythema, edema, exudate.). Negative for color change and rash.  Neurological:  Negative for seizures and syncope.  All other systems reviewed and are negative.    Physical Exam Triage Vital Signs ED Triage Vitals  Encounter Vitals Group     BP 04/04/23 1242 137/85     Systolic BP Percentile --      Diastolic BP Percentile --      Pulse Rate 04/04/23 1242 97     Resp 04/04/23 1242 18     Temp 04/04/23 1242 98.4 F (36.9 C)     Temp src --      SpO2 04/04/23 1242 98 %     Weight --      Height --      Head Circumference --      Peak Flow --      Pain Score 04/04/23 1241 0     Pain Loc --      Pain Education --      Exclude from Growth Chart --  No data found.  Updated Vital  Signs BP 137/85 (BP Location: Right Arm)   Pulse 97   Temp 98.4 F (36.9 C)   Resp 18   LMP 04/04/2023 (Approximate)   SpO2 98%   Visual Acuity Right Eye Distance:   Left Eye Distance:   Bilateral Distance:    Right Eye Near:   Left Eye Near:    Bilateral Near:     Physical Exam Vitals and nursing note reviewed.  Constitutional:      General: She is not in acute distress.    Appearance: She is well-developed.  HENT:     Head: Normocephalic and atraumatic.     Right Ear: Hearing, tympanic membrane, ear canal and external ear normal.     Left Ear: Hearing, tympanic membrane, ear canal and external ear normal.     Nose: Congestion and rhinorrhea present. No mucosal edema. Rhinorrhea is clear.     Right Sinus: No maxillary sinus tenderness or frontal sinus tenderness.     Left Sinus: No maxillary sinus tenderness or frontal sinus tenderness.     Mouth/Throat:     Lips: Pink.     Mouth: Mucous membranes are moist.     Pharynx: Uvula midline. No oropharyngeal exudate or posterior oropharyngeal erythema.     Tonsils: No tonsillar exudate.  Eyes:     Conjunctiva/sclera: Conjunctivae normal.     Pupils: Pupils are equal, round, and reactive to light.  Cardiovascular:     Rate and Rhythm: Normal rate and regular rhythm.     Heart sounds: Normal heart sounds, S1 normal and S2 normal. No murmur heard. Pulmonary:     Effort: Pulmonary effort is normal. No respiratory distress.     Breath sounds: Normal breath sounds. No decreased breath sounds, wheezing, rhonchi or rales.  Abdominal:     Palpations: Abdomen is soft.     Tenderness: There is no abdominal tenderness.  Musculoskeletal:        General: No swelling.     Cervical back: Neck supple.  Lymphadenopathy:     Head:     Right side of head: No submental, submandibular, tonsillar, preauricular or posterior auricular adenopathy.     Left side of head: No submental, submandibular, tonsillar, preauricular or posterior auricular  adenopathy.     Cervical: No cervical adenopathy.     Right cervical: No superficial cervical adenopathy.    Left cervical: No superficial cervical adenopathy.  Skin:    General: Skin is warm and dry.     Capillary Refill: Capillary refill takes less than 2 seconds.     Findings: No rash.     Comments: Right anterior thigh with a round area of minimal discoloration.  This is not a bull's-eye rash.  There is a central area that is dry and crusty and appears to have been in previous insect bite.  The skin is healing well.  Neurological:     Mental Status: She is alert and oriented to person, place, and time.  Psychiatric:        Mood and Affect: Mood is anxious.      UC Treatments / Results  Labs (all labs ordered are listed, but only abnormal results are displayed) Labs Reviewed  POC COVID19/FLU A&B COMBO - Normal  LYME DISEASE SEROLOGY W/REFLEX    EKG   Radiology No results found.  Procedures Procedures (including critical care time)  Medications Ordered in UC Medications - No data to display  Initial Impression / Assessment  and Plan / UC Course  I have reviewed the triage vital signs and the nursing notes.  Pertinent labs & imaging results that were available during my care of the patient were reviewed by me and considered in my medical decision making (see chart for details).  Patient's had multiple exposures to the flu.  Her rapid flu and COVID were negative.  If symptoms persist return for flu testing.  Get plenty of rest and fluids.  Use acetaminophen or ibuprofen, as needed, every 4 hours, as directed on the package, for fever or bodyaches.  Reevaluated her right leg site of the bite and for swelling.  Right leg is normal.  Lyme titer sent.  Will contact the patient if the titer is abnormal.  If titer is normal to be available on the portal.  Return here if symptoms do not improve, worsen, or new symptoms occur.  Provided a work excuse.  Final Clinical  Impressions(s) / UC Diagnoses   Final diagnoses:  Acute cough  Generalized body aches  Viral URI with cough  Insect bite of right thigh, subsequent encounter     Discharge Instructions      Regarding insect bite: Thighs and legs measure close to the same size with minimal variation.  Bite site is healing well and no sign of persistent bull's-eye rash or Lyme rash.  Lyme titer drawn and sent.  If titer is abnormal we will contact the patient.  If titer is normal results will be available on the portal.  Rapid flu and COVID were negative.  Symptoms are most consistent with a viral illness.  Get plenty of fluids.  Get plenty of rest.  Use acetaminophen or ibuprofen, as needed, every 4 hours, for fever or bodyaches.  Return here if symptoms do not improve, worsen, or new symptoms occur.     ED Prescriptions   None    PDMP not reviewed this encounter.   Prescilla Sours, FNP 04/04/23 1316

## 2023-04-04 NOTE — ED Triage Notes (Addendum)
Pt c/o started with headache x 2 days ago, now congested, not feeling good, coughing started yesterday. 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

## 2023-04-05 LAB — LYME DISEASE SEROLOGY W/REFLEX: Lyme Total Antibody EIA: NEGATIVE

## 2023-04-08 NOTE — Progress Notes (Signed)
Negative.  Patient updated. Via VM message.

## 2023-04-25 ENCOUNTER — Ambulatory Visit (HOSPITAL_BASED_OUTPATIENT_CLINIC_OR_DEPARTMENT_OTHER): Payer: 59 | Admitting: Student

## 2023-04-25 ENCOUNTER — Encounter (HOSPITAL_BASED_OUTPATIENT_CLINIC_OR_DEPARTMENT_OTHER): Payer: Self-pay | Admitting: Student

## 2023-04-25 VITALS — BP 125/85 | HR 86 | Temp 98.4°F | Ht 62.0 in | Wt 175.8 lb

## 2023-04-25 DIAGNOSIS — R202 Paresthesia of skin: Secondary | ICD-10-CM | POA: Diagnosis not present

## 2023-04-25 DIAGNOSIS — D560 Alpha thalassemia: Secondary | ICD-10-CM | POA: Diagnosis not present

## 2023-04-25 DIAGNOSIS — Z13220 Encounter for screening for lipoid disorders: Secondary | ICD-10-CM

## 2023-04-25 DIAGNOSIS — K581 Irritable bowel syndrome with constipation: Secondary | ICD-10-CM

## 2023-04-25 DIAGNOSIS — Z0000 Encounter for general adult medical examination without abnormal findings: Secondary | ICD-10-CM

## 2023-04-25 DIAGNOSIS — Z136 Encounter for screening for cardiovascular disorders: Secondary | ICD-10-CM

## 2023-04-25 DIAGNOSIS — Z131 Encounter for screening for diabetes mellitus: Secondary | ICD-10-CM

## 2023-04-25 NOTE — Assessment & Plan Note (Signed)
 Stable-continue vitamin B12 supplementation.

## 2023-04-25 NOTE — Assessment & Plan Note (Signed)
 Discussed current bowel regimen. Discussed possible recommendations with miralax if this is not working.

## 2023-04-25 NOTE — Patient Instructions (Signed)
 It was nice to see you today!  As we discussed in clinic I will see you in 6 months.  If you have any problems before your next visit feel free to message me via MyChart (minor issues or questions) or call the office, otherwise you may reach out to schedule an office visit.  Thank you! Crista Nuon, PA-C

## 2023-04-25 NOTE — Assessment & Plan Note (Signed)
 Continue to follow with hematology-Dr. Harles Lied. Last infusion was summer 2024.

## 2023-04-25 NOTE — Progress Notes (Signed)
 Complete physical exam  Patient: Judy George   DOB: 1980-12-11   43 y.o. Female  MRN: [id number]  Subjective:    Chief Complaint  Patient presents with   Medical Management of Chronic Issues    Here for follow up. Was seeing Uro for kidney recheck so will not see Uro until March due to being on vacation. They wanted PCP to order CMP. Other labs are ok per pt. Has not had baseline labs done yet.     Judy George is a 43 y.o. female who presents today for a complete physical exam. She reports consuming a general diet. Trying to eat more fruits. The patient does not participate in regular exercise at present. Walks to class. Encouraged to count steps. She generally feels well. She reports sleeping fairly well.   Paresthesias- Stable. Notes that it is not as bad. Has been supplementing B12. Still following with Ortho.  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 Ezzard at [facility]. Chronically low Hgb at baseline. Gets IV iron, first infusion was over the summer of this past year.  Patient notes that she felt much better after infusion.   Cardiology- Sees Dr. Derotha(?) [place] there was a pin sized hole in her heart years ago. Feels like she gets short of breath on long walks- no wheezing.   Constipation- Discussed current bowel regimen. Discussed possible recommendations with miralax if this is not working.  Most recent fall risk assessment:    02/07/2023    8:51 AM  Fall Risk   Falls in the past year? 0  Number falls in past yr: 0  Injury with Fall? 0  Risk for fall due to : No Fall Risks  Follow up Falls evaluation completed     Most recent depression screenings:    03/14/2023    8:14 AM 02/07/2023    8:51 AM  PHQ 2/9 Scores  PHQ - 2 Score 0 0  PHQ- 9 Score 2 1        Patient Care Team: Roxie Kreeger T, PA-C as PCP - General (Physician Assistant) Sheena Pugh, DO as PCP - Cardiology (Cardiology)   Outpatient Medications Prior to Visit   Medication Sig   FIBER ADULT GUMMIES PO Take by mouth. Take 3 gummies daily   linaclotide  (LINZESS ) 290 MCG CAPS capsule Take 1 capsule (290 mcg total) by mouth daily before breakfast.   pantoprazole  (PROTONIX ) 40 MG tablet Take 1 tablet (40 mg total) by mouth daily. (Patient taking differently: Take 40 mg by mouth as needed.)   polyethylene glycol powder (MIRALAX) 17 GM/SCOOP powder Take by mouth daily.   [DISCONTINUED] predniSONE  (STERAPRED UNI-PAK 21 TAB) 10 MG (21) TBPK tablet Take as directed (Patient not taking: Reported on 04/25/2023)   No facility-administered medications prior to visit.    ROS  Per HPI      Objective:     BP 125/85 (BP Location: Left Arm, Patient Position: Sitting, Cuff Size: Normal)   Pulse 86   Temp 98.4 F (36.9 C) (Oral)   Ht 5' 2 (1.575 m)   Wt 175 lb 12.8 oz (79.7 kg)   LMP 04/04/2023 (Approximate)   SpO2 98%   BMI 32.15 kg/m    Physical Exam Constitutional:      General: She is not in acute distress.    Appearance: Normal appearance. She is not ill-appearing or diaphoretic.  HENT:     Head: Normocephalic and atraumatic.     Right Ear: Tympanic membrane, ear  canal and external ear normal.     Left Ear: Tympanic membrane, ear canal and external ear normal.     Nose: Nose normal.     Mouth/Throat:     Mouth: Mucous membranes are moist.     Pharynx: Oropharynx is clear.  Eyes:     General: No scleral icterus.       Right eye: No discharge.        Left eye: No discharge.     Extraocular Movements: Extraocular movements intact.     Conjunctiva/sclera: Conjunctivae normal.     Pupils: Pupils are equal, round, and reactive to light.  Neck:     Thyroid: No thyroid mass, thyromegaly or thyroid tenderness.     Vascular: No carotid bruit.  Cardiovascular:     Rate and Rhythm: Normal rate and regular rhythm.     Pulses: Normal pulses.     Heart sounds: Normal heart sounds. No murmur heard.    No friction rub. No gallop.  Pulmonary:      Effort: Pulmonary effort is normal.     Breath sounds: Normal breath sounds. No wheezing, rhonchi or rales.  Chest:     Chest wall: No tenderness.  Abdominal:     General: Bowel sounds are normal. There is no distension.     Palpations: Abdomen is soft.     Tenderness: There is no abdominal tenderness. There is no guarding.  Musculoskeletal:        General: No swelling, deformity or signs of injury. Normal range of motion.     Cervical back: Neck supple.     Right lower leg: No edema.     Left lower leg: No edema.     Comments: LE Strength 3+ -L leg , plantar flexion 4+ -R leg (dominant), plantar flexion  UE Strength 4+ - L and R flexion and extension of elbow  Lymphadenopathy:     Cervical: No cervical adenopathy.     Right cervical: No superficial or posterior cervical adenopathy.    Left cervical: No superficial cervical adenopathy.  Skin:    General: Skin is warm and dry.     Coloration: Skin is not jaundiced.     Findings: No rash.  Neurological:     General: No focal deficit present.     Mental Status: She is alert.     Motor: No weakness.     Deep Tendon Reflexes: Reflexes normal.  Psychiatric:        Mood and Affect: Mood normal.        Behavior: Behavior normal.      No results found for any visits on 04/25/23.     Assessment & Plan:    Routine Health Maintenance and Physical Exam  Health Maintenance  Topic Date Due   HIV Screening  Never done   Hepatitis C Screening  Never done   Flu Shot  05/18/2023*   COVID-19 Vaccine (1 - 2024-25 season) 05/18/2023*   Mammogram  03/05/2024   Pap with HPV screening  04/10/2027   DTaP/Tdap/Td vaccine (3 - Td or Tdap) 10/28/2029   HPV Vaccine  Aged Out  *Topic was postponed. The date shown is not the original due date.   Encouraged her to engage in regular exercise appropriate for her age and condition.  Adult general medical exam  Paresthesia Assessment & Plan: Stable-continue vitamin B12  supplementation.   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 Ezzard. Last infusion was summer 2024.  Irritable bowel syndrome with constipation Assessment & Plan: Discussed current bowel regimen. Discussed possible recommendations with miralax if this is not working.    I have spent greater than 40 minutes charting, educating, diagnosing and managing this patient for this visit.   Return in about 6 months (around 10/23/2023) for Chronic Followup.     Lupie Sawa T Obediah Welles, PA-C

## 2023-04-26 ENCOUNTER — Encounter (HOSPITAL_BASED_OUTPATIENT_CLINIC_OR_DEPARTMENT_OTHER): Payer: Self-pay | Admitting: Student

## 2023-04-26 LAB — COMPREHENSIVE METABOLIC PANEL
ALT: 10 [IU]/L (ref 0–32)
AST: 16 [IU]/L (ref 0–40)
Albumin: 4.4 g/dL (ref 3.9–4.9)
Alkaline Phosphatase: 66 [IU]/L (ref 44–121)
BUN/Creatinine Ratio: 9 (ref 9–23)
BUN: 5 mg/dL — ABNORMAL LOW (ref 6–24)
Bilirubin Total: 0.2 mg/dL (ref 0.0–1.2)
CO2: 22 mmol/L (ref 20–29)
Calcium: 9.8 mg/dL (ref 8.7–10.2)
Chloride: 102 mmol/L (ref 96–106)
Creatinine, Ser: 0.58 mg/dL (ref 0.57–1.00)
Globulin, Total: 3.3 g/dL (ref 1.5–4.5)
Glucose: 93 mg/dL (ref 70–99)
Potassium: 4.9 mmol/L (ref 3.5–5.2)
Sodium: 141 mmol/L (ref 134–144)
Total Protein: 7.7 g/dL (ref 6.0–8.5)
eGFR: 114 mL/min/{1.73_m2} (ref >59)

## 2023-04-26 LAB — CBC WITH DIFFERENTIAL/PLATELET
Basophils Absolute: 0 10*3/uL (ref 0.0–0.2)
Basos: 0 %
EOS (ABSOLUTE): 0.1 10*3/uL (ref 0.0–0.4)
Eos: 1 %
Hematocrit: 38.2 % (ref 34.0–46.6)
Hemoglobin: 11.5 g/dL (ref 11.1–15.9)
Immature Grans (Abs): 0 10*3/uL (ref 0.0–0.1)
Immature Granulocytes: 0 %
Lymphocytes Absolute: 2.7 10*3/uL (ref 0.7–3.1)
Lymphs: 35 %
MCH: 22.6 pg — ABNORMAL LOW (ref 26.6–33.0)
MCHC: 30.1 g/dL — ABNORMAL LOW (ref 31.5–35.7)
MCV: 75 fL — ABNORMAL LOW (ref 79–97)
Monocytes Absolute: 0.5 10*3/uL (ref 0.1–0.9)
Monocytes: 7 %
Neutrophils Absolute: 4.5 10*3/uL (ref 1.4–7.0)
Neutrophils: 57 %
Platelets: 361 10*3/uL (ref 150–450)
RBC: 5.09 x10E6/uL (ref 3.77–5.28)
RDW: 14 % (ref 11.7–15.4)
WBC: 7.9 10*3/uL (ref 3.4–10.8)

## 2023-04-26 LAB — HEMOGLOBIN A1C
Est. average glucose Bld gHb Est-mCnc: 114 mg/dL
Hgb A1c MFr Bld: 5.6 % (ref 4.8–5.6)

## 2023-04-26 LAB — LIPID PANEL
Chol/HDL Ratio: 2.6 {ratio} (ref 0.0–4.4)
Cholesterol, Total: 210 mg/dL — ABNORMAL HIGH (ref 100–199)
HDL: 81 mg/dL (ref >39)
LDL Chol Calc (NIH): 119 mg/dL — ABNORMAL HIGH (ref 0–99)
Triglycerides: 53 mg/dL (ref 0–149)
VLDL Cholesterol Cal: 10 mg/dL (ref 5–40)

## 2023-06-17 ENCOUNTER — Telehealth: Payer: Self-pay | Admitting: Gastroenterology

## 2023-06-17 NOTE — Telephone Encounter (Signed)
 Inbound call from patient states she has been having trouble the last week having a bowl movement. Requesting to speak with a nurse.

## 2023-06-17 NOTE — Telephone Encounter (Signed)
 Pt stated that she is having constipation, having BM but small. Pt is currently on Linzess 290, Miralax, and taking Fiber.  Pt was provided instructions for a Miralax Purge. Pt also requested that she be scheduled for a Colonoscopy and pt stated that this was discussed at last office visit with Dr. Chales Abrahams.  Chart was reviewed. Pt was scheduled for the Colonoscopy  on 09/06/2023 at 9:00 AM. Pt made aware.  Pt was scheduled for a previsit on 08/09/2023 at 8:00 AM. Pt made aware. Pt verbalized understanding with all questions answered.

## 2023-07-08 ENCOUNTER — Other Ambulatory Visit: Payer: Self-pay | Admitting: Gastroenterology

## 2023-07-08 ENCOUNTER — Encounter (HOSPITAL_BASED_OUTPATIENT_CLINIC_OR_DEPARTMENT_OTHER): Payer: Self-pay | Admitting: Student

## 2023-07-08 ENCOUNTER — Ambulatory Visit (HOSPITAL_BASED_OUTPATIENT_CLINIC_OR_DEPARTMENT_OTHER): Admitting: Student

## 2023-07-08 VITALS — BP 121/78 | HR 64 | Temp 97.8°F | Resp 16 | Ht 62.0 in | Wt 173.6 lb

## 2023-07-08 DIAGNOSIS — E282 Polycystic ovarian syndrome: Secondary | ICD-10-CM | POA: Insufficient documentation

## 2023-07-08 DIAGNOSIS — E66811 Obesity, class 1: Secondary | ICD-10-CM | POA: Diagnosis not present

## 2023-07-08 DIAGNOSIS — L853 Xerosis cutis: Secondary | ICD-10-CM | POA: Insufficient documentation

## 2023-07-08 DIAGNOSIS — Z6831 Body mass index (BMI) 31.0-31.9, adult: Secondary | ICD-10-CM

## 2023-07-08 DIAGNOSIS — N951 Menopausal and female climacteric states: Secondary | ICD-10-CM | POA: Insufficient documentation

## 2023-07-08 DIAGNOSIS — E6609 Other obesity due to excess calories: Secondary | ICD-10-CM

## 2023-07-08 LAB — POCT URINE PREGNANCY: Preg Test, Ur: NEGATIVE

## 2023-07-08 MED ORDER — TRIAMCINOLONE ACETONIDE 0.1 % EX CREA: 1.0000 | TOPICAL_CREAM | Freq: Two times a day (BID) | CUTANEOUS | 0 refills | Status: DC

## 2023-07-08 MED ORDER — PHENTERMINE HCL 37.5 MG PO TABS: 37.5000 mg | ORAL_TABLET | Freq: Every day | ORAL | 0 refills | Status: DC

## 2023-07-08 NOTE — Patient Instructions (Signed)
 It was nice to see you today!  As we discussed in clinic:  I will let you know about your lab work.  I have sent in phentermine  for weight loss- please let me know about any side effects and triamcinolone  cream (steroid) for the itching.  If you have any problems before your next visit feel free to message me via MyChart (minor issues or questions) or call the office, otherwise you may reach out to schedule an office visit.  Thank you! Jennier Schissler, PA-C

## 2023-07-08 NOTE — Assessment & Plan Note (Signed)
 She reports itching on her back and knees, particularly when walking or sweating. There is no history of eczema, but symptoms are consistent with either dry skin or eczema. Skin is well-moisturized. Recommended symptomatic treatment with hydrocortisone cream and anti-itch lotions. - Recommend using hydrocortisone cream as needed - Suggest using Sarna anti-itch lotion or CeraVe itch relief lotion - Advise moisturizing after showering, focusing on upper back, knees, and ankles

## 2023-07-08 NOTE — Assessment & Plan Note (Signed)
 She is experiencing symptoms suggestive of early menopause, including irregular periods, night sweats, fatigue, and mental fog. At 43 years old, this is considered early for menopause. Differential diagnosis includes menopause and other hormonal imbalances. 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 - Order FSH level to assess menopausal status - Order thyroid function tests - Order prolactin level - Perform urine pregnancy test

## 2023-07-08 NOTE — Assessment & Plan Note (Signed)
 She has PCOS, managed with lifestyle modifications, and experiences facial hair and acne. Discussed potential use of spironolactone to manage these symptoms, but no immediate changes were made to her treatment plan. - Consider spironolactone for management of PCOS symptoms if needed in the future

## 2023-07-08 NOTE — Assessment & Plan Note (Signed)
 She is concerned about her weight, currently 173 lbs, and is motivated to lose weight to improve her overall health and manage her PCOS. She has been experiencing knee pain and has been walking 20 minutes a day and monitoring her calorie intake. Discussed the benefits of weight loss for her PCOS and knee pain. Phentermine  (Adipex) was suggested to aid in weight loss, with a history of previous success. Discussed potential side effects of phentermine , including increased anxiety, palpitations, and elevated blood pressure. She is aware of the need for monthly renewal and to take it before breakfast with food to minimize nausea. - Prescribe phentermine  (Adipex) - Advise to track calorie intake for two weeks, including all condiments and snacks - Encourage to aim for 10,000 steps per day - Advise to continue walking and monitor knee pain

## 2023-07-08 NOTE — Progress Notes (Signed)
 Established Patient Office Visit  Subjective   Patient ID: Judy George, female    DOB: 01-Dec-1980  Age: 42 y.o. MRN: [id number]  Chief Complaint  Patient presents with   Weight Loss    Would like to discuss weight loss & previous labs from December 2024.    Menopause    Needed to see if she is having sxs of menopause. Only had 1 day during menstrual cycle last month and is for sure she is not pregnant. Has tubes tied. Waking up in sweats. Keeps temp at home cooler than the family would likes.    HPI  Discussed the use of AI scribe software for clinical note transcription with the patient, who gave verbal consent to proceed.  History of Present Illness   Judy George is a 43 year old female with thalassemia and PCOS who presents with symptoms of menopause and weight management concerns.  She has been experiencing irregular menstrual cycles, with her last period occurring at the beginning of last month for only one day, characterized by light and dark spotting. There has been no return of her period since then, and she wonders if this could be related to a previously diagnosed cyst- I do not believe so.  She experiences night sweats, particularly around her neck, which have been causing her to keep the house cooler than usual. Despite typically feeling cold due to her thalassemia, she has been feeling warmer at home. No major hot flashes during the day. She reports mental fog and fatigue, often forgetting tasks and feeling tired while driving to work. No vaginal dryness, but she acknowledges the possibility of future symptoms as she ages.  She has been experiencing itching on her back, particularly in one area, prompting her husband to buy a back scratcher. The itching occurs without any specific triggers and is not alleviated by any treatments she has tried.  She is concerned about her weight, noting knee pain and the need to lose weight. She has been walking 20 minutes a day  and cutting back on calories, particularly reducing calcium and salt intake due to previous kidney stones. She previously lost weight using phentermine  and is considering weight management options again.  She has a history of thalassemia and PCOS, which has caused irregular periods and facial hair growth. She has not had issues with her periods since they became regular until recently. She has not used medications like spironolactone for PCOS symptoms.  She mentions a past concern about leukopenia noted in her chart, which she initially worried might be related to her thalassemia. However, her recent blood work showed normal white blood cell counts.  She is scheduled for a colonoscopy in June, with a pre-appointment at the end of May.       ROS Per HPI.    Objective:     BP 121/78   Pulse 64   Temp 97.8 F (36.6 C) (Oral)   Resp 16   Ht 5\' 2"  (1.575 m)   Wt 173 lb 9.6 oz (78.7 kg)   LMP 05/18/2023 (Approximate)   SpO2 98%   BMI 31.75 kg/m    Physical Exam Constitutional:      General: She is not in acute distress.    Appearance: Normal appearance. She is not ill-appearing.  HENT:     Head: Normocephalic and atraumatic.     Nose: Nose normal.  Eyes:     General: No scleral icterus.    Conjunctiva/sclera: Conjunctivae normal.  Cardiovascular:  Rate and Rhythm: Normal rate and regular rhythm.     Heart sounds: Normal heart sounds. No murmur heard.    No friction rub.  Pulmonary:     Effort: Pulmonary effort is normal. No respiratory distress.     Breath sounds: Normal breath sounds. No wheezing, rhonchi or rales.  Musculoskeletal:        General: Normal range of motion.  Skin:    General: Skin is warm and dry.     Coloration: Skin is not jaundiced or pale.     Comments: Dry skin around trapezius and scapular areas of back  Neurological:     General: No focal deficit present.     Mental Status: She is alert.  Psychiatric:        Mood and Affect: Mood normal.         Behavior: Behavior normal.      Results for orders placed or performed in visit on 07/08/23  POCT urine pregnancy  Result Value Ref Range   Preg Test, Ur Negative Negative      The 10-year ASCVD risk score (Arnett DK, et al., 2019) is: 0.4%    Assessment & Plan:   Class 1 obesity due to excess calories without serious comorbidity with body mass index (BMI) of 31.0 to 31.9 in adult Assessment & Plan: She is concerned about her weight, currently 173 lbs, and is motivated to lose weight to improve her overall health and manage her PCOS. She has been experiencing knee pain and has been walking 20 minutes a day and monitoring her calorie intake. Discussed the benefits of weight loss for her PCOS and knee pain. Phentermine  (Adipex) was suggested to aid in weight loss, with a history of previous success. Discussed potential side effects of phentermine , including increased anxiety, palpitations, and elevated blood pressure. She is aware of the need for monthly renewal and to take it before breakfast with food to minimize nausea. - Prescribe phentermine  (Adipex) - Advise to track calorie intake for two weeks, including all condiments and snacks - Encourage to aim for 10,000 steps per day - Advise to continue walking and monitor knee pain  Orders: -     Phentermine  HCl; Take 1 tablet (37.5 mg total) by mouth daily before breakfast.  Dispense: 30 tablet; Refill: 0  Xerosis cutis Assessment & Plan: She reports itching on her back and knees, particularly when walking or sweating. There is no history of eczema, but symptoms are consistent with either dry skin or eczema. Skin is well-moisturized. Recommended symptomatic treatment with hydrocortisone cream and anti-itch lotions. - Recommend using hydrocortisone cream as needed - Suggest using Sarna anti-itch lotion or CeraVe itch relief lotion - Advise moisturizing after showering, focusing on upper back, knees, and ankles  Orders: -      Triamcinolone  Acetonide; Apply 1 Application topically 2 (two) times daily. As needed.  Dispense: 45 g; Refill: 0  Perimenopausal symptoms Assessment & Plan: She is experiencing symptoms suggestive of early menopause, including irregular periods, night sweats, fatigue, and mental fog. At 43 years old, this is considered early for menopause. Differential diagnosis includes menopause and other hormonal imbalances. 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 - Order FSH level to assess menopausal status - Order thyroid function tests - Order prolactin level - Perform urine pregnancy test  Orders: -     Follicle stimulating hormone -     Prolactin -     TSH +  free T4 -     POCT urine pregnancy  PCOS (polycystic ovarian syndrome) Assessment & Plan: She has PCOS, managed with lifestyle modifications, and experiences facial hair and acne. Discussed potential use of spironolactone to manage these symptoms, but no immediate changes were made to her treatment plan. - Consider spironolactone for management of PCOS symptoms if needed in the future   Return in about 3 months (around 10/07/2023), or if any side effects of medication, for weight loss/ adipex check.    Monick Rena T Meridee Branum, PA-C

## 2023-07-09 ENCOUNTER — Encounter (HOSPITAL_BASED_OUTPATIENT_CLINIC_OR_DEPARTMENT_OTHER): Payer: Self-pay | Admitting: Student

## 2023-07-09 LAB — TSH+FREE T4
Free T4: 1.08 ng/dL (ref 0.82–1.77)
TSH: 0.965 u[IU]/mL (ref 0.450–4.500)

## 2023-07-09 LAB — PROLACTIN: Prolactin: 7.5 ng/mL (ref 4.8–33.4)

## 2023-07-09 LAB — FOLLICLE STIMULATING HORMONE: FSH: 26.3 m[IU]/mL

## 2023-07-11 NOTE — Telephone Encounter (Signed)
 Copied from CRM [id number]. Topic: Clinical - Medical Advice >> Jul 11, 2023  4:14 PM Alyse July wrote: Reason for CRM: Patient sent an additional message to provider that she has not received a response to and wanted to contact the office to ensure provider received the message.

## 2023-07-12 ENCOUNTER — Other Ambulatory Visit (HOSPITAL_BASED_OUTPATIENT_CLINIC_OR_DEPARTMENT_OTHER): Payer: Self-pay | Admitting: Student

## 2023-07-12 DIAGNOSIS — L700 Acne vulgaris: Secondary | ICD-10-CM

## 2023-07-12 MED ORDER — SPIRONOLACTONE 25 MG PO TABS: 25.0000 mg | ORAL_TABLET | Freq: Every day | ORAL | 6 refills | Status: DC

## 2023-07-12 MED ORDER — TRETINOIN 0.05 % EX CREA: TOPICAL_CREAM | Freq: Every day | CUTANEOUS | 0 refills | Status: DC

## 2023-07-17 NOTE — Progress Notes (Signed)
 [facility]  [address] [place],  [place]  [address] [phone]  Clinic Day: 07/18/2023  Referring physician: Viola Greulich, MD   HISTORY OF PRESENT ILLNESS:  The patient is a 43 y.o. female  with microcytic anemia secondary to both iron deficiency anemia and alpha thalassemia minor (@-/@-).  She was last given IV iron in July 2024.  She comes in to reassess her iron and hemoglobin levels.  Overall, she claims to be doing fine.  She does have occasional episodes of fatigue.  She claims her menstrual cycles have not been particularly heavy.  She denies having other overt forms of blood loss.  At the time her alpha thalassemia was diagnosed in May 2018, her hemoglobin was 11.6.   PHYSICAL EXAM:  Blood pressure 112/76, pulse 88, temperature 99.1 F (37.3 C), temperature source Oral, resp. rate 16, height 5\' 2"  (1.575 m), weight 175 lb 1.6 oz (79.4 kg), last menstrual period 05/18/2023, SpO2 100%. Wt Readings from Last 3 Encounters:  07/18/23 175 lb 1.6 oz (79.4 kg)  07/08/23 173 lb 9.6 oz (78.7 kg)  04/25/23 175 lb 12.8 oz (79.7 kg)   Body mass index is 32.03 kg/m. Performance status (ECOG): 0 - Asymptomatic Physical Exam Constitutional:      Appearance: Normal appearance. She is not ill-appearing.  HENT:     Mouth/Throat:     Mouth: Mucous membranes are moist.     Pharynx: Oropharynx is clear. No oropharyngeal exudate or posterior oropharyngeal erythema.  Cardiovascular:     Rate and Rhythm: Normal rate and regular rhythm.     Heart sounds: No murmur heard.    No friction rub. No gallop.  Pulmonary:     Effort: Pulmonary effort is normal. No respiratory distress.     Breath sounds: Normal breath sounds. No wheezing, rhonchi or rales.  Abdominal:     General: Bowel sounds are normal. There is no distension.     Palpations: Abdomen is soft. There is no mass.     Tenderness: There is no abdominal tenderness.  Musculoskeletal:         General: No swelling.     Right lower leg: No edema.     Left lower leg: No edema.  Lymphadenopathy:     Cervical: No cervical adenopathy.     Upper Body:     Right upper body: No supraclavicular or axillary adenopathy.     Left upper body: No supraclavicular or axillary adenopathy.     Lower Body: No right inguinal adenopathy. No left inguinal adenopathy.  Skin:    General: Skin is warm.     Coloration: Skin is not jaundiced.     Findings: No lesion or rash.  Neurological:     General: No focal deficit present.     Mental Status: She is alert and oriented to person, place, and time. Mental status is at baseline.  Psychiatric:        Mood and Affect: Mood normal.        Behavior: Behavior normal.        Thought Content: Thought content normal.    LABS:      Latest Ref Rng & Units 07/18/2023    2:24 PM 04/25/2023    4:25 PM 03/14/2023    9:03 AM  CBC  WBC 4.0 - 10.5 K/uL 6.1  7.9  6.1   Hemoglobin 12.0 - 15.0 g/dL 91.4  78.2  95.6   Hematocrit 36.0 - 46.0 %  37.2  38.2  40.4   Platelets 150 - 400 K/uL 272  361  300       Latest Ref Rng & Units 04/25/2023    4:25 PM 02/07/2023    9:29 AM 04/09/2022    9:39 AM  CMP  Glucose 70 - 99 mg/dL 93  86  88   BUN 6 - 24 mg/dL 5  4  7    Creatinine 0.57 - 1.00 mg/dL 0.98  1.19  1.47   Sodium 134 - 144 mmol/L 141  139  136   Potassium 3.5 - 5.2 mmol/L 4.9  4.7  3.7   Chloride 96 - 106 mmol/L 102  103  101   CO2 20 - 29 mmol/L 22  28  27    Calcium 8.7 - 10.2 mg/dL 9.8  9.3  9.4   Total Protein 6.0 - 8.5 g/dL 7.7  7.3  7.9   Total Bilirubin 0.0 - 1.2 mg/dL 0.2  0.5  0.4   Alkaline Phos 44 - 121 IU/L 66  59  55   AST 0 - 40 IU/L 16  17  21    ALT 0 - 32 IU/L 10  10  11      Latest Reference Range & Units 07/18/23 14:24  Iron 28 - 170 ug/dL 63  UIBC ug/dL 829  TIBC 562 - 130 ug/dL 865  Saturation Ratios 10.4 - 31.8 % 17  Ferritin 11 - 307 ng/mL 38  Folate >5.9 ng/mL 15.2  Vitamin B12 180 - 914 pg/mL 343    ASSESSMENT & PLAN:  A  43 y.o. female with alpha thalassemia minor (@-/@-) and iron deficiency anemia.  Her hemoglobin and iron levels have not significantly changed over these past 6 months.  As she does have alpha thalassemia, her hemoglobin will always be slightly low.  Overall, she appears to be doing well.  I will see her back in another 6 months for repeat clinical assessment.  The patient understands all the plans discussed today and is in agreement with them.  Azyriah Nevins Felicia Horde, MD

## 2023-07-18 ENCOUNTER — Encounter: Payer: Self-pay | Admitting: Oncology

## 2023-07-18 ENCOUNTER — Inpatient Hospital Stay: Payer: Self-pay | Attending: Oncology

## 2023-07-18 ENCOUNTER — Inpatient Hospital Stay: Payer: BC Managed Care – PPO | Admitting: Oncology

## 2023-07-18 ENCOUNTER — Telehealth: Payer: Self-pay | Admitting: Oncology

## 2023-07-18 VITALS — BP 112/76 | HR 88 | Temp 99.1°F | Resp 16 | Ht 62.0 in | Wt 175.1 lb

## 2023-07-18 DIAGNOSIS — D563 Thalassemia minor: Secondary | ICD-10-CM | POA: Diagnosis not present

## 2023-07-18 DIAGNOSIS — D509 Iron deficiency anemia, unspecified: Secondary | ICD-10-CM | POA: Diagnosis present

## 2023-07-18 DIAGNOSIS — D500 Iron deficiency anemia secondary to blood loss (chronic): Secondary | ICD-10-CM

## 2023-07-18 LAB — CBC WITH DIFFERENTIAL (CANCER CENTER ONLY)
Abs Immature Granulocytes: 0.02 10*3/uL (ref 0.00–0.07)
Basophils Absolute: 0 10*3/uL (ref 0.0–0.1)
Basophils Relative: 1 %
Eosinophils Absolute: 0.1 10*3/uL (ref 0.0–0.5)
Eosinophils Relative: 1 %
HCT: 37.2 % (ref 36.0–46.0)
Hemoglobin: 11.4 g/dL — ABNORMAL LOW (ref 12.0–15.0)
Immature Granulocytes: 0 %
Lymphocytes Relative: 37 %
Lymphs Abs: 2.2 10*3/uL (ref 0.7–4.0)
MCH: 22.1 pg — ABNORMAL LOW (ref 26.0–34.0)
MCHC: 30.6 g/dL (ref 30.0–36.0)
MCV: 72 fL — ABNORMAL LOW (ref 80.0–100.0)
Monocytes Absolute: 0.4 10*3/uL (ref 0.1–1.0)
Monocytes Relative: 6 %
Neutro Abs: 3.4 10*3/uL (ref 1.7–7.7)
Neutrophils Relative %: 55 %
Platelet Count: 272 10*3/uL (ref 150–400)
RBC: 5.17 MIL/uL — ABNORMAL HIGH (ref 3.87–5.11)
RDW: 13.5 % (ref 11.5–15.5)
WBC Count: 6.1 10*3/uL (ref 4.0–10.5)
nRBC: 0 % (ref 0.0–0.2)
nRBC: 0 /100{WBCs}

## 2023-07-18 LAB — IRON AND TIBC
Iron: 63 ug/dL (ref 28–170)
Saturation Ratios: 17 % (ref 10.4–31.8)
TIBC: 374 ug/dL (ref 250–450)
UIBC: 311 ug/dL

## 2023-07-18 LAB — VITAMIN B12: Vitamin B-12: 343 pg/mL (ref 180–914)

## 2023-07-18 LAB — FERRITIN: Ferritin: 38 ng/mL (ref 11–307)

## 2023-07-18 LAB — FOLATE: Folate: 15.2 ng/mL (ref >5.9)

## 2023-07-18 NOTE — Telephone Encounter (Signed)
 Patient has been scheduled for follow-up visit per 06/3023 LOS.  LVM notifying pt of appt details, provided my direct number to pt if appt changes need to be made.

## 2023-07-19 ENCOUNTER — Other Ambulatory Visit (HOSPITAL_BASED_OUTPATIENT_CLINIC_OR_DEPARTMENT_OTHER): Payer: Self-pay

## 2023-07-19 ENCOUNTER — Telehealth (HOSPITAL_BASED_OUTPATIENT_CLINIC_OR_DEPARTMENT_OTHER): Payer: Self-pay

## 2023-07-19 NOTE — Telephone Encounter (Signed)
 Pt advised that PA was submitted for Retin-A 

## 2023-07-21 ENCOUNTER — Encounter: Payer: Self-pay | Admitting: Oncology

## 2023-07-22 ENCOUNTER — Other Ambulatory Visit (HOSPITAL_BASED_OUTPATIENT_CLINIC_OR_DEPARTMENT_OTHER): Payer: Self-pay

## 2023-07-29 ENCOUNTER — Other Ambulatory Visit: Payer: Self-pay | Admitting: Oncology

## 2023-08-05 ENCOUNTER — Other Ambulatory Visit (HOSPITAL_BASED_OUTPATIENT_CLINIC_OR_DEPARTMENT_OTHER): Payer: Self-pay | Admitting: Student

## 2023-08-05 DIAGNOSIS — E6609 Other obesity due to excess calories: Secondary | ICD-10-CM

## 2023-08-05 MED ORDER — PHENTERMINE HCL 37.5 MG PO TABS: 37.5000 mg | ORAL_TABLET | Freq: Every day | ORAL | 0 refills | Status: DC

## 2023-08-05 NOTE — Telephone Encounter (Signed)
 Copied from CRM [id number]. Topic: Clinical - Medication Refill >> Aug 05, 2023  4:23 PM [name] wrote: Medication: phentermine  (ADIPEX-P ) 37.5 MG tablet  Has the patient contacted their pharmacy? Yes - call dr (Agent: If no, request that the patient contact the pharmacy for the refill. If patient does not wish to contact the pharmacy document the reason why and proceed with request.) (Agent: If yes, when and what did the pharmacy advise?)  This is the patient's preferred pharmacy:  [facility] [address], [place] - [address] [place] [address] Phone: [phone] Fax: [phone]  Is this the correct pharmacy for this prescription? Yes If no, delete pharmacy and type the correct one.   Has the prescription been filled recently? No  Is the patient out of the medication? No, almost out 3 left  Has the patient been seen for an appointment in the last year OR does the patient have an upcoming appointment? Yes  Can we respond through MyChart? Yes  Agent: Please be advised that Rx refills may take up to 3 business days. We ask that you follow-up with your pharmacy.

## 2023-08-06 ENCOUNTER — Other Ambulatory Visit: Payer: Self-pay | Admitting: Gastroenterology

## 2023-08-09 ENCOUNTER — Ambulatory Visit (AMBULATORY_SURGERY_CENTER)

## 2023-08-09 ENCOUNTER — Encounter: Payer: Self-pay | Admitting: Gastroenterology

## 2023-08-09 VITALS — Ht 62.0 in | Wt 165.0 lb

## 2023-08-09 DIAGNOSIS — K581 Irritable bowel syndrome with constipation: Secondary | ICD-10-CM

## 2023-08-09 MED ORDER — NA SULFATE-K SULFATE-MG SULF 17.5-3.13-1.6 GM/177ML PO SOLN: 1.0000 | Freq: Once | ORAL | 0 refills | Status: AC

## 2023-08-09 NOTE — Progress Notes (Signed)
 No egg or soy allergy known to patient  No issues known to pt with past sedation with any surgeries or procedures Patient denies ever being told they had issues or difficulty with intubation  No FH of Malignant Hyperthermia Pt is not on diet pills Pt is not on  home 02  Pt is not on blood thinners  Chronic constipation  No A fib or A flutter Have any cardiac testing pending-- no  LOA: independent  Prep: suprep   Patient's chart reviewed by Rogena Class CNRA prior to previsit and patient appropriate for the [facility].  Previsit completed and red dot placed by patient's name on their procedure day (on provider's schedule).     PV completed with patient. Prep instructions sent via mychart and home address.

## 2023-08-12 ENCOUNTER — Other Ambulatory Visit (HOSPITAL_BASED_OUTPATIENT_CLINIC_OR_DEPARTMENT_OTHER): Payer: Self-pay | Admitting: Student

## 2023-08-12 DIAGNOSIS — L700 Acne vulgaris: Secondary | ICD-10-CM

## 2023-08-16 ENCOUNTER — Encounter: Payer: Self-pay | Admitting: Cardiology

## 2023-08-16 ENCOUNTER — Other Ambulatory Visit

## 2023-08-16 ENCOUNTER — Ambulatory Visit (INDEPENDENT_AMBULATORY_CARE_PROVIDER_SITE_OTHER): Admitting: Cardiology

## 2023-08-16 VITALS — BP 128/84 | HR 102 | Ht 62.0 in | Wt 170.1 lb

## 2023-08-16 DIAGNOSIS — I4729 Other ventricular tachycardia: Secondary | ICD-10-CM

## 2023-08-16 DIAGNOSIS — R0609 Other forms of dyspnea: Secondary | ICD-10-CM | POA: Diagnosis not present

## 2023-08-16 NOTE — Patient Instructions (Addendum)
 Medication Instructions:  Your physician recommends that you continue on your current medications as directed. Please refer to the Current Medication list given to you today.  *If you need a refill on your cardiac medications before your next appointment, please call your pharmacy*   Testing/Procedures: Your physician has requested that you have an echocardiogram. Echocardiography is a painless test that uses sound waves to create images of your heart. It provides your doctor with information about the size and shape of your heart and how well your heart's chambers and valves are working. This procedure takes approximately one hour. There are no restrictions for this procedure. Please do NOT wear cologne, perfume, aftershave, or lotions (deodorant is allowed). Please arrive 15 minutes prior to your appointment time.  Please note: We ask at that you not bring children with you during ultrasound (echo/ vascular) testing. Due to room size and safety concerns, children are not allowed in the ultrasound rooms during exams. Our front office staff cannot provide observation of children in our lobby area while testing is being conducted. An adult accompanying a patient to their appointment will only be allowed in the ultrasound room at the discretion of the ultrasound technician under special circumstances. We apologize for any inconvenience.  ZIO XT- Long Term Monitor Instructions  Your physician has requested you wear a ZIO patch monitor for 14 days.  This is a single patch monitor. [employer] supplies one patch monitor per enrollment. Additional stickers are not available. Please do not apply patch if you will be having a Nuclear Stress Test,  Echocardiogram, Cardiac CT, MRI, or Chest Xray during the period you would be wearing the  monitor. The patch cannot be worn during these tests. You cannot remove and re-apply the  ZIO XT patch monitor.  Your ZIO patch monitor will be mailed 3 day USPS to your  address on file. It may take 3-5 days  to receive your monitor after you have been enrolled.  Once you have received your monitor, please review the enclosed instructions. Your monitor  has already been registered assigning a specific monitor serial # to you.  Billing and Patient Assistance Program Information  We have supplied [employer] with any of your insurance information on file for billing purposes. [employer] offers a sliding scale Patient Assistance Program for patients that do not have  insurance, or whose insurance does not completely cover the cost of the ZIO monitor.  You must apply for the Patient Assistance Program to qualify for this discounted rate.  To apply, please call [employer] at [phone], select option 4, select option 2, ask to apply for  Patient Assistance Program. Sanna Crystal will ask your household income, and how many people  are in your household. They will quote your out-of-pocket cost based on that information.  [employer] will also be able to set up a 23-month, interest-free payment plan if needed.  Applying the monitor    Do not shower for the first 24 hours. You may shower after the first 24 hours.  Press the button if you feel a symptom. You will hear a small click. Record Date, Time and  Symptom in the Patient Logbook.  When you are ready to remove the patch, follow instructions on the last 2 pages of Patient  Logbook. Stick patch monitor onto the last page of Patient Logbook.  Place Patient Logbook in the blue and white box. Use locking tab on box and tape box closed  securely. The blue and white box has prepaid postage  on it. Please place it in the mailbox as  soon as possible. Your physician should have your test results approximately 7 days after the  monitor has been mailed back to [facility].  Call [facility] Customer Care at [phone] if you have questions regarding  your ZIO XT patch monitor. Call them immediately if you see an orange light  blinking on your  monitor.  If your monitor falls off in less than 4 days, contact our Monitor department at [phone].  If your monitor becomes loose or falls off after 4 days call [employer] at [phone] for  suggestions on securing your monitor  Follow-Up: At [facility], you and your health needs are our priority.  As part of our continuing mission to provide you with exceptional heart care, our providers are all part of one team.  This team includes your primary Cardiologist (physician) and Advanced Practice Providers or APPs (Physician Assistants and Nurse Practitioners) who all work together to provide you with the care you need, when you need it.  Your next appointment:   2 year(s)  Provider:   Kardie Tobb, DO

## 2023-08-16 NOTE — Progress Notes (Signed)
 Cardio-Obstetrics Clinic  New Evaluation  Date:  08/16/2023   ID:  Judy George, Judy George 09-Jun-1980, MRN [id number]  PCP:  Rothfuss, Alexia Angelucci   [facility] Providers Cardiologist:  Paxson Harrower, DO  Electrophysiologist:  None       Referring MD: Cherylene Corrente, PA-C   Chief Complaint: " I am doing well- this is my routine  History of Present Illness:    Judy George is a 43 y.o. female [G1P0] who is being seen today for the evaluation of small muscular ventricular septal defect, NSVT at the request of Rothfuss, Laron Plummer, PA-C.   Hx of non-sustained ventricular tachycardia who presents for follow-up of her cardiac condition.   She has non-sustained ventricular tachycardia identified on a zio monitor. A coronary CTA showed no coronary artery disease but revealed a small muscular ventricular septal defect. She denies palpitations or syncope but recalls an episode of heart racing while sitting.  She experiences shortness of breath, particularly when walking, but denies swelling or other signs of heart failure. No presyncope is noted.  Her last lipid panel in February showed slightly elevated total cholesterol. She is aware of her family history of coronary artery disease.  Prior CV Studies Reviewed: The following studies were reviewed today: Echo and CCTA   Past Medical History:  Diagnosis Date   AKI (acute kidney injury) (HCC) 03/14/2023   Alpha+ thalassemia (HCC)    Anemia    Asymptomatic bacteriuria 03/14/2023   BV (bacterial vaginosis) 03/14/2023   Chronic constipation 06/05/2016   Diastasis recti 11/30/2015   History of colon polyps    History of irregular heartbeat 06/05/2016   Kidney stones    Ovarian cyst    right    Past Surgical History:  Procedure Laterality Date   CESAREAN SECTION     X3   COLONOSCOPY  2016   Dr Melven Stable in [place] 1 removed and it was non cancerous    HERNIA REPAIR  04/2013   TUBAL LIGATION        OB History      Gravida  1   Para      Term      Preterm      AB      Living         SAB      IAB      Ectopic      Multiple      Live Births                  Current Medications: Current Meds  Medication Sig   FIBER ADULT GUMMIES PO Take by mouth. Take 3 gummies daily   linaclotide  (LINZESS ) 290 MCG CAPS capsule TAKE 1 CAPSULE BY MOUTH ONCE DAILY BEFORE BREAKFAST   pantoprazole  (PROTONIX ) 40 MG tablet Take 1 tablet (40 mg total) by mouth daily. (Patient taking differently: Take 40 mg by mouth as needed.)   phentermine  (ADIPEX-P ) 37.5 MG tablet Take 1 tablet (37.5 mg total) by mouth daily before breakfast.   polyethylene glycol powder (MIRALAX) 17 GM/SCOOP powder Take by mouth daily.   spironolactone  (ALDACTONE ) 25 MG tablet Take 1 tablet (25 mg total) by mouth daily.   tretinoin  (RETIN-A ) 0.05 % cream APPLY  CREAM TOPICALLY TO AFFECTED AREA AT BEDTIME   triamcinolone  cream (KENALOG ) 0.1 % Apply 1 Application topically 2 (two) times daily. As needed.     Allergies:   Patient has no known allergies.   Social History  Socioeconomic History   Marital status: Married    Spouse name: Evan Hillock   Number of children: 4   Years of education: Not on file   Highest education level: Bachelor's degree (e.g., BA, AB, BS)  Occupational History   Occupation: Dentist  Tobacco Use   Smoking status: Never    Passive exposure: Never   Smokeless tobacco: Never  Vaping Use   Vaping status: Never Used  Substance and Sexual Activity   Alcohol use: Never   Drug use: Never   Sexual activity: Yes    Partners: Male    Birth control/protection: Surgical  Other Topics Concern   Not on file  Social History Narrative   Lives at home with husband 3 biological children & 1 adopted   R handed      No Caffeine   Social Drivers of Corporate investment banker Strain: Low Risk  (07/07/2023)   Overall Financial Resource Strain (CARDIA)    Difficulty of Paying Living Expenses:  Not hard at all  Food Insecurity: No Food Insecurity (07/07/2023)   Hunger Vital Sign    Worried About Running Out of Food in the Last Year: Never true    Ran Out of Food in the Last Year: Never true  Transportation Needs: No Transportation Needs (07/07/2023)   PRAPARE - Administrator, Civil Service (Medical): No    Lack of Transportation (Non-Medical): No  Physical Activity: Insufficiently Active (07/07/2023)   Exercise Vital Sign    Days of Exercise per Week: 5 days    Minutes of Exercise per Session: 20 min  Stress: No Stress Concern Present (07/07/2023)   [employer] of Occupational Health - Occupational Stress Questionnaire    Feeling of Stress : Not at all  Social Connections: Socially Integrated (07/07/2023)   Social Connection and Isolation Panel [NHANES]    Frequency of Communication with Friends and Family: More than three times a week    Frequency of Social Gatherings with Friends and Family: More than three times a week    Attends Religious Services: More than 4 times per year    Active Member of [employer] or Organizations: Yes    Attends Engineer, structural: More than 4 times per year    Marital Status: Married      Family History  Problem Relation Age of Onset   Hypertension Mother    Colon polyps Mother    Atrial fibrillation Mother        surgery   Diabetes Maternal Aunt    Heart attack Maternal Aunt    Kidney Stones Maternal Grandmother    Asthma Maternal Grandmother    Hyperlipidemia Maternal Grandmother    Hypertension Maternal Grandmother    Kidney disease Maternal Grandmother    Heart disease Maternal Grandfather    Heart attack Maternal Grandfather    Hypertension Maternal Grandfather    Hyperlipidemia Maternal Grandfather    Colon cancer Neg Hx    Esophageal cancer Neg Hx    Rectal cancer Neg Hx    Stomach cancer Neg Hx       ROS:   Please see the history of present illness.     All other systems reviewed and are  negative.   Labs/EKG Reviewed:    EKG:  none today    Recent Labs: 02/07/2023: Magnesium 1.8 04/25/2023: ALT 10; BUN 5; Creatinine, Ser 0.58; Potassium 4.9; Sodium 141 07/08/2023: TSH 0.965 07/18/2023: Hemoglobin 11.4; Platelet Count 272   Recent Lipid  Panel Lab Results  Component Value Date/Time   CHOL 210 (H) 04/25/2023 04:25 PM   TRIG 53 04/25/2023 04:25 PM   HDL 81 04/25/2023 04:25 PM   CHOLHDL 2.6 04/25/2023 04:25 PM   CHOLHDL 3 04/09/2022 09:39 AM   LDLCALC 119 (H) 04/25/2023 04:25 PM    Physical Exam:    VS:  BP 128/84 (BP Location: Left Arm, Patient Position: Sitting, Cuff Size: Normal)   Pulse (!) 102   Ht 5\' 2"  (1.575 m)   Wt 170 lb 1.6 oz (77.2 kg)   LMP 07/30/2023 (Exact Date)   SpO2 100%   BMI 31.11 kg/m     Wt Readings from Last 3 Encounters:  08/16/23 170 lb 1.6 oz (77.2 kg)  08/09/23 165 lb (74.8 kg)  07/18/23 175 lb 1.6 oz (79.4 kg)     GEN:  Well nourished, well developed in no acute distress HEENT: Normal NECK: No JVD; No carotid bruits LYMPHATICS: No lymphadenopathy CARDIAC: RRR, no murmurs, rubs, gallops RESPIRATORY:  Clear to auscultation without rales, wheezing or rhonchi  ABDOMEN: Soft, non-tender, non-distended MUSCULOSKELETAL:  No edema; No deformity  SKIN: Warm and dry NEUROLOGIC:  Alert and oriented x 3 PSYCHIATRIC:  Normal affect    Risk Assessment/Risk Calculators:                 ASSESSMENT & PLAN:    Non-sustained ventricular tachycardia (NSVT) NSVT with previous 9-second run in 2022. Monitoring required to prevent progression to significant arrhythmias. - Order 14-day cardiac monitor to assess for atrial fibrillation and other arrhythmias.  Ventricular septal defect (VSD) Small muscular VSD identified on coronary CTA. Monitoring right heart function is essential. - Order echocardiogram to assess right heart function.  Elevated cholesterol Slightly elevated total cholesterol. No significant coronary artery disease.  Monitoring lipids and lipoprotein A levels will guide prevention strategies. - Instruct her to monitor total cholesterol levels. - Order lipoprotein A test with next lipid panel to assess risk and guide primary prevention.   There are no Patient Instructions on file for this visit.   Dispo:  No follow-ups on file.   Medication Adjustments/Labs and Tests Ordered: Current medicines are reviewed at length with the patient today.  Concerns regarding medicines are outlined above.  Tests Ordered: No orders of the defined types were placed in this encounter.  Medication Changes: No orders of the defined types were placed in this encounter.

## 2023-09-05 ENCOUNTER — Telehealth: Payer: Self-pay | Admitting: *Deleted

## 2023-09-05 DIAGNOSIS — K581 Irritable bowel syndrome with constipation: Secondary | ICD-10-CM

## 2023-09-05 NOTE — Telephone Encounter (Signed)
 Just 2DE. Can proceed with colon if 2DE is neg Will cc Carin [place]

## 2023-09-05 NOTE — Telephone Encounter (Signed)
 Dr. Venice Gillis,  This pt is scheduled with you tomorrow.  I was just made aware she is undergoing a cardiac w/u for DOE that has not been completed. If a cardiac clearance could be obtained today we could go ahead; if not we will delay until her w/u is completed.  Best regards,  Cathryn Cobb

## 2023-09-05 NOTE — Telephone Encounter (Signed)
 Patient wants to know what else she will need to do after she has her echo on 09-24-23. Please advise she rescheduled for 10-11-23 to have her colonoscopy.

## 2023-09-05 NOTE — Telephone Encounter (Signed)
 Patient made aware. Aware to call us  1 week after testing if she hasn't heard from us 

## 2023-09-05 NOTE — Telephone Encounter (Signed)
 Pl cancel her colon for tomorrow RE: needs cardiac clearence prior. Cardio has ordered 2DE- per Autry Legions [address] let pt know  RG

## 2023-09-06 ENCOUNTER — Encounter: Admitting: Gastroenterology

## 2023-09-16 ENCOUNTER — Other Ambulatory Visit (HOSPITAL_BASED_OUTPATIENT_CLINIC_OR_DEPARTMENT_OTHER): Payer: Self-pay | Admitting: Student

## 2023-09-16 DIAGNOSIS — E6609 Other obesity due to excess calories: Secondary | ICD-10-CM

## 2023-09-23 ENCOUNTER — Ambulatory Visit (INDEPENDENT_AMBULATORY_CARE_PROVIDER_SITE_OTHER): Admitting: Student

## 2023-09-23 ENCOUNTER — Encounter (HOSPITAL_BASED_OUTPATIENT_CLINIC_OR_DEPARTMENT_OTHER): Payer: Self-pay | Admitting: Student

## 2023-09-23 VITALS — BP 105/70 | HR 82 | Temp 97.7°F | Resp 16 | Ht 62.0 in | Wt 168.5 lb

## 2023-09-23 DIAGNOSIS — E7841 Elevated Lipoprotein(a): Secondary | ICD-10-CM | POA: Diagnosis not present

## 2023-09-23 DIAGNOSIS — Z13220 Encounter for screening for lipoid disorders: Secondary | ICD-10-CM

## 2023-09-23 DIAGNOSIS — Z136 Encounter for screening for cardiovascular disorders: Secondary | ICD-10-CM

## 2023-09-23 DIAGNOSIS — K581 Irritable bowel syndrome with constipation: Secondary | ICD-10-CM

## 2023-09-23 DIAGNOSIS — Z8742 Personal history of other diseases of the female genital tract: Secondary | ICD-10-CM

## 2023-09-23 DIAGNOSIS — Z87442 Personal history of urinary calculi: Secondary | ICD-10-CM

## 2023-09-23 DIAGNOSIS — E282 Polycystic ovarian syndrome: Secondary | ICD-10-CM | POA: Diagnosis not present

## 2023-09-23 DIAGNOSIS — L700 Acne vulgaris: Secondary | ICD-10-CM | POA: Insufficient documentation

## 2023-09-23 NOTE — Assessment & Plan Note (Addendum)
 Chronic alongside PCOS, stable. She is using Retin-A  cream for acne, which is causing some skin dryness and peeling. The cream is helping with acne but not with scarring. Suggested consulting a dermatologist for scarring issues. - Continue Retin-A  cream, consider using ev ery other night if too drying - Consult dermatologist for acne scarring treatment

## 2023-09-23 NOTE — Patient Instructions (Signed)
 It was nice to see you today!  If you have any problems before your next visit feel free to message me via MyChart (minor issues or questions) or call the office, otherwise you may reach out to schedule an office visit.  Thank you! Pau Banh, PA-C

## 2023-09-23 NOTE — Assessment & Plan Note (Signed)
 Chronic, experiencing side effect of nausea.  She reports irregular menses, a longstanding issue due to PCOS, and symptoms that concern her for menopause, including cognitive fogginess and sleep disturbances. A recent ultrasound showed no current ovarian cysts. Restarting spironolactone  was advised with meals to reduce nausea, and monitoring blood pressure was recommended due to her low baseline blood pressure. - Restart spironolactone  with meals to reduce nausea - Monitor blood pressure due to spironolactone  use - Consult OB GYN if menstrual irregularities worsen

## 2023-09-23 NOTE — Progress Notes (Signed)
 Established Patient Office Visit  Subjective   Patient ID: Judy George, female    DOB: 1980/06/30  Age: 43 y.o. MRN: [id number]  Chief Complaint  Patient presents with   Medical Management of Chronic Issues    Follow up. Phentermine  feels like it stalled. Is not losing anymore.     Light headedness    In the past few days, has gotten light headed. 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     HPI  Discussed the use of AI scribe software for clinical note transcription with the patient, who gave verbal consent to proceed.  History of Present Illness   Judy George is a 43 year old female with PCOS who presents for follow-up on weight management.  She has been taking phentermine  37.5 mg for weight loss, but its effectiveness has diminished, with her weight plateauing at 168 lbs from 170 lbs since the last visit. She is engaging in more physical activity, including walking and attempting to tackle a hill near her home, but experiences shortness of breath and discomfort, causing her to turn back.  She visited a cardiologist last month and wore a heart monitor, with results pending. An echocardiogram is scheduled for tomorrow. She has a family history of atrial fibrillation, as her mother and uncle are affected. She has been feeling lightheaded, which she attributes to stress from a challenging law class and prolonged computer use.  She experiences symptoms suggestive of menopause, including brain fog and irregular sleep patterns. Her menstrual cycle remains irregular, consistent with her history of PCOS. An ultrasound performed by her OB GYN showed no ovarian cysts.  She has a history of kidney stones, which have not moved according to her previous urologist. She was hospitalized for a week due to a kidney infection related to the stones. She is seeking a new urologist due to dissatisfaction with her previous care.  She experiences constipation, with  infrequent and hard bowel movements. She uses Linzess  and Miralax, which are effective when they work. An ultrasound indicated difficulty visualizing due to stool presence.  She is using Retin-A  cream for acne, which has caused skin dryness and peeling but has improved acne. She is also on spironolactone  for PCOS symptoms but stopped due to nausea, which she plans to restart with meals to mitigate side effects.      Patient Active Problem List   Diagnosis Date Noted   Acne vulgaris 09/23/2023   Xerosis cutis 07/08/2023   Perimenopausal symptoms 07/08/2023   PCOS (polycystic ovarian syndrome) 07/08/2023   Calculus of kidney 03/14/2023   Reactive airway disease 03/14/2023   Hx of renal calculi 03/14/2023   Iron deficiency anemia due to chronic blood loss 10/05/2022   History of PCOS 09/17/2022   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   History of colon polyps    Alpha+ thalassemia (HCC)    Thalassemia minor 07/12/2019   Class 1 obesity due to excess calories without serious comorbidity with body mass index (BMI) of 31.0 to 31.9 in adult 06/04/2019   History of irregular heartbeat 06/05/2016   Ventral hernia 11/30/2015   Past Medical History:  Diagnosis Date   AKI (acute kidney injury) (HCC) 03/14/2023   Alpha+ thalassemia (HCC)    Anemia    Asymptomatic bacteriuria 03/14/2023   BV (bacterial vaginosis) 03/14/2023   Chronic constipation 06/05/2016   Diastasis recti 11/30/2015   History of colon polyps  History of irregular heartbeat 06/05/2016   Kidney stones    Ovarian cyst    right   Social History   Tobacco Use   Smoking status: Never    Passive exposure: Never   Smokeless tobacco: Never  Vaping Use   Vaping status: Never Used  Substance Use Topics   Alcohol use: Never   Drug use: Never   No Known Allergies    ROS Per HPI.    Objective:     BP 105/70   Pulse 82   Temp 97.7 F (36.5 C) (Oral)   Resp  16   Ht 5' 2 (1.575 m)   Wt 168 lb 8 oz (76.4 kg)   LMP 08/18/2023 (Approximate)   SpO2 100%   BMI 30.82 kg/m  BP Readings from Last 3 Encounters:  09/23/23 105/70  08/16/23 128/84  07/18/23 112/76   Wt Readings from Last 3 Encounters:  09/23/23 168 lb 8 oz (76.4 kg)  08/16/23 170 lb 1.6 oz (77.2 kg)  08/09/23 165 lb (74.8 kg)      Physical Exam Constitutional:      General: She is not in acute distress.    Appearance: Normal appearance. She is not ill-appearing.  HENT:     Head: Normocephalic and atraumatic.     Nose: Nose normal.  Eyes:     General: No scleral icterus.    Conjunctiva/sclera: Conjunctivae normal.  Cardiovascular:     Rate and Rhythm: Normal rate and regular rhythm.     Heart sounds: Normal heart sounds. No murmur heard.    No friction rub.  Pulmonary:     Effort: Pulmonary effort is normal. No respiratory distress.     Breath sounds: Normal breath sounds. No wheezing, rhonchi or rales.  Musculoskeletal:        General: Normal range of motion.  Skin:    General: Skin is warm and dry.     Coloration: Skin is not jaundiced or pale.  Neurological:     General: No focal deficit present.     Mental Status: She is alert.  Psychiatric:        Mood and Affect: Mood normal.        Behavior: Behavior normal.      No results found for any visits on 09/23/23.  Last CBC Lab Results  Component Value Date   WBC 6.1 07/18/2023   HGB 11.4 (L) 07/18/2023   HCT 37.2 07/18/2023   MCV 72.0 (L) 07/18/2023   MCH 22.1 (L) 07/18/2023   RDW 13.5 07/18/2023   PLT 272 07/18/2023   Last metabolic panel Lab Results  Component Value Date   GLUCOSE 93 04/25/2023   NA 141 04/25/2023   K 4.9 04/25/2023   CL 102 04/25/2023   CO2 22 04/25/2023   BUN 5 (L) 04/25/2023   CREATININE 0.58 04/25/2023   EGFR 114 04/25/2023   CALCIUM 9.8 04/25/2023   PROT 7.7 04/25/2023   ALBUMIN 4.4 04/25/2023   LABGLOB 3.3 04/25/2023   AGRATIO 1.5 10/23/2019   BILITOT 0.2  04/25/2023   ALKPHOS 66 04/25/2023   AST 16 04/25/2023   ALT 10 04/25/2023   Last lipids Lab Results  Component Value Date   CHOL 210 (H) 04/25/2023   HDL 81 04/25/2023   LDLCALC 119 (H) 04/25/2023   TRIG 53 04/25/2023   CHOLHDL 2.6 04/25/2023   Last hemoglobin A1c Lab Results  Component Value Date   HGBA1C 5.6 04/25/2023   Last thyroid functions Lab Results  Component Value Date   TSH 0.965 07/08/2023    The 10-year ASCVD risk score (Arnett DK, et al., 2019) is: 0.2%    Assessment & Plan:   Elevated lipoprotein(a) -     Lipoprotein A (LPA)  PCOS (polycystic ovarian syndrome) Assessment & Plan: Chronic, experiencing side effect of nausea.  She reports irregular menses, a longstanding issue due to PCOS, and symptoms that concern her for menopause, including cognitive fogginess and sleep disturbances. A recent ultrasound showed no current ovarian cysts. Restarting spironolactone  was advised with meals to reduce nausea, and monitoring blood pressure was recommended due to her low baseline blood pressure. - Restart spironolactone  with meals to reduce nausea - Monitor blood pressure due to spironolactone  use - Consult OB GYN if menstrual irregularities worsen   Hx of renal calculi Assessment & Plan: She has kidney stones present on both sides, which have not moved. She requests a referral to a new urologist due to dissatisfaction with her previous provider. - Refer to urology at [facility] location  Orders: -     Ambulatory referral to Urology  Encounter for lipid screening for cardiovascular disease -     Lipoprotein A (LPA) -     Lipid panel  Acne vulgaris Assessment & Plan: Chronic alongside PCOS, stable. She is using Retin-A  cream for acne, which is causing some skin dryness and peeling. The cream is helping with acne but not with scarring. Suggested consulting a dermatologist for scarring issues. - Continue Retin-A  cream, consider using ev ery other night if  too drying - Consult dermatologist for acne scarring treatment   Irritable bowel syndrome with constipation Assessment & Plan: Chronic, not at goal. She experiences infrequent and hard bowel movements, which may contribute to abdominal pain when lying on her stomach. She is currently using Linzess  and Miralax, which help but are not always effective. Suggested using Senokot as needed for additional relief and emphasized the importance of adequate water intake. - Continue Linzess  and Miralax - Use Senokot as needed for constipation relief up to every 3 days - Encourage increased water intake - Continue to follow with GI.   History of PCOS Assessment & Plan: Chronic, stable. She reports irregular menses, a longstanding issue due to PCOS, and symptoms that concern her for menopause, including cognitive fogginess and sleep disturbances. A recent ultrasound showed no current ovarian cysts. Restarting spironolactone  was advised with meals to reduce nausea, and monitoring blood pressure was recommended due to her low baseline blood pressure. - Restart spironolactone  with meals to reduce nausea - Monitor blood pressure due to spironolactone  use - Consult OB GYN if menstrual irregularities worsen   Reviewed OB/GYN note from 09/02/2023.  Return in about 3 months (around 12/24/2023) for Chronic Followup.    Elmond Poehlman T Symon Norwood, PA-C

## 2023-09-23 NOTE — Assessment & Plan Note (Signed)
 Chronic, not at goal. She experiences infrequent and hard bowel movements, which may contribute to abdominal pain when lying on her stomach. She is currently using Linzess  and Miralax, which help but are not always effective. Suggested using Senokot as needed for additional relief and emphasized the importance of adequate water intake. - Continue Linzess  and Miralax - Use Senokot as needed for constipation relief up to every 3 days - Encourage increased water intake - Continue to follow with GI.

## 2023-09-23 NOTE — Assessment & Plan Note (Signed)
 She has kidney stones present on both sides, which have not moved. She requests a referral to a new urologist due to dissatisfaction with her previous provider. - Refer to urology at [facility] location

## 2023-09-23 NOTE — Assessment & Plan Note (Signed)
 Chronic, stable. She reports irregular menses, a longstanding issue due to PCOS, and symptoms that concern her for menopause, including cognitive fogginess and sleep disturbances. A recent ultrasound showed no current ovarian cysts. Restarting spironolactone  was advised with meals to reduce nausea, and monitoring blood pressure was recommended due to her low baseline blood pressure. - Restart spironolactone  with meals to reduce nausea - Monitor blood pressure due to spironolactone  use - Consult OB GYN if menstrual irregularities worsen

## 2023-09-24 ENCOUNTER — Ambulatory Visit (HOSPITAL_COMMUNITY)
Admission: RE | Admit: 2023-09-24 | Discharge: 2023-09-24 | Disposition: A | Discharge status: Home / Self Care | Source: Ambulatory Visit | Attending: Cardiology | Admitting: Cardiology

## 2023-09-24 ENCOUNTER — Ambulatory Visit (HOSPITAL_BASED_OUTPATIENT_CLINIC_OR_DEPARTMENT_OTHER): Payer: Self-pay | Admitting: Student

## 2023-09-24 ENCOUNTER — Ambulatory Visit: Payer: Self-pay | Admitting: Cardiology

## 2023-09-24 ENCOUNTER — Encounter: Payer: Self-pay | Admitting: Cardiology

## 2023-09-24 DIAGNOSIS — R0600 Dyspnea, unspecified: Secondary | ICD-10-CM

## 2023-09-24 DIAGNOSIS — R0609 Other forms of dyspnea: Secondary | ICD-10-CM | POA: Insufficient documentation

## 2023-09-24 LAB — ECHOCARDIOGRAM COMPLETE
Area-P 1/2: 4.45 cm2
S' Lateral: 3.4 cm

## 2023-09-24 LAB — LIPID PANEL
Chol/HDL Ratio: 2.6 ratio (ref 0.0–4.4)
Cholesterol, Total: 178 mg/dL (ref 100–199)
HDL: 69 mg/dL (ref >39)
LDL Chol Calc (NIH): 102 mg/dL — ABNORMAL HIGH (ref 0–99)
Triglycerides: 34 mg/dL (ref 0–149)
VLDL Cholesterol Cal: 7 mg/dL (ref 5–40)

## 2023-09-24 LAB — LIPOPROTEIN A (LPA): Lipoprotein (a): 77 nmol/L — ABNORMAL HIGH (ref <75.0)

## 2023-09-25 NOTE — Telephone Encounter (Signed)
 Please advise patient had her echo done. Is she cleared to have the procedure?

## 2023-09-25 NOTE — Telephone Encounter (Signed)
 2DE- Nl ?  Cleared from anesthesia standpoint

## 2023-09-26 ENCOUNTER — Encounter: Payer: Self-pay | Admitting: Gastroenterology

## 2023-09-26 NOTE — Telephone Encounter (Signed)
 Patient made aware and said she will look on MyChart for her instructions and was told when to stop her phentermine 

## 2023-09-27 ENCOUNTER — Other Ambulatory Visit (HOSPITAL_BASED_OUTPATIENT_CLINIC_OR_DEPARTMENT_OTHER): Payer: Self-pay

## 2023-09-27 ENCOUNTER — Ambulatory Visit (HOSPITAL_BASED_OUTPATIENT_CLINIC_OR_DEPARTMENT_OTHER): Payer: Self-pay | Admitting: Student

## 2023-09-27 ENCOUNTER — Ambulatory Visit (HOSPITAL_BASED_OUTPATIENT_CLINIC_OR_DEPARTMENT_OTHER): Admitting: Student

## 2023-09-27 ENCOUNTER — Ambulatory Visit: Payer: Self-pay | Admitting: *Deleted

## 2023-09-27 VITALS — BP 115/78 | HR 90 | Temp 97.8°F | Resp 16 | Ht 62.0 in | Wt 168.2 lb

## 2023-09-27 DIAGNOSIS — I4729 Other ventricular tachycardia: Secondary | ICD-10-CM | POA: Diagnosis not present

## 2023-09-27 DIAGNOSIS — E860 Dehydration: Secondary | ICD-10-CM

## 2023-09-27 DIAGNOSIS — R519 Headache, unspecified: Secondary | ICD-10-CM | POA: Diagnosis not present

## 2023-09-27 DIAGNOSIS — R42 Dizziness and giddiness: Secondary | ICD-10-CM | POA: Diagnosis not present

## 2023-09-27 LAB — CBC WITH DIFFERENTIAL/PLATELET
Basophils Absolute: 0 x10E3/uL (ref 0.0–0.2)
Basos: 1 %
EOS (ABSOLUTE): 0 x10E3/uL (ref 0.0–0.4)
Eos: 1 %
Hematocrit: 40.6 % (ref 34.0–46.6)
Hemoglobin: 12.1 g/dL (ref 11.1–15.9)
Immature Grans (Abs): 0 x10E3/uL (ref 0.0–0.1)
Immature Granulocytes: 0 %
Lymphocytes Absolute: 1.7 x10E3/uL (ref 0.7–3.1)
Lymphs: 28 %
MCH: 22.2 pg — ABNORMAL LOW (ref 26.6–33.0)
MCHC: 29.8 g/dL — ABNORMAL LOW (ref 31.5–35.7)
MCV: 75 fL — ABNORMAL LOW (ref 79–97)
Monocytes Absolute: 0.4 x10E3/uL (ref 0.1–0.9)
Monocytes: 6 %
Neutrophils Absolute: 4 x10E3/uL (ref 1.4–7.0)
Neutrophils: 64 %
Platelets: 315 x10E3/uL (ref 150–450)
RBC: 5.44 x10E6/uL — ABNORMAL HIGH (ref 3.77–5.28)
RDW: 14.1 % (ref 11.7–15.4)
WBC: 6.1 x10E3/uL (ref 3.4–10.8)

## 2023-09-27 LAB — POCT URINALYSIS DIPSTICK
Bilirubin, UA: NEGATIVE
Blood, UA: NEGATIVE
Glucose, UA: NEGATIVE
Ketones, UA: NEGATIVE
Leukocytes, UA: NEGATIVE
Nitrite, UA: NEGATIVE
Protein, UA: NEGATIVE
Spec Grav, UA: 1.02 (ref 1.010–1.025)
Urobilinogen, UA: 1 U/dL
pH, UA: 7 (ref 5.0–8.0)

## 2023-09-27 MED ORDER — MECLIZINE HCL 25 MG PO TABS
25.0000 mg | ORAL_TABLET | Freq: Three times a day (TID) | ORAL | 0 refills | Status: DC | PRN
  Filled 2023-09-27: qty 30, 10d supply, fill #0

## 2023-09-27 NOTE — Progress Notes (Signed)
 Acute Office Visit  Subjective:     Patient ID: Judy George, female    DOB: 01-29-1981, 43 y.o.   MRN: [id number]  Chief Complaint  Patient presents with   Dizziness    Had earache 2-3 days ago.  Last night was light headed, not feeling good. Got up to use restroom and room was spinning. Husband gave her some motion sickness. Called to get an rx but was told she needed to be seen. Back pain that started yesterday. Having some headaches. Taking some tylenol 500mg  2 tablets prn.    HPI  Discussed the use of AI scribe software for clinical note transcription with the patient, who gave verbal consent to proceed.  History of Present Illness   The patient is a 43 year old who presents with dizziness and vertigo.  She has been experiencing dizziness and a sensation of the room spinning starting at 3 AM today, which worsens with movement or changes in position. She attempted to alleviate the symptoms with motion sickness pills, but they provided no relief. Associated symptoms include nausea and lightheadedness. No recent illness is reported, but she notes feeling unwell and lightheaded, possibly due to prolonged computer use. She states that it has not been present at the clinic today.  She mentions a recent earache that began around Wednesday, which was severe but has since subsided. There is no current ear pain or tinnitus. No recent changes in hearing are reported.  She has a history of headaches, often occurring upon waking. These headaches are not severe and typically resolve with medication or on their own. She has not been diagnosed with migraines. She reports photophobia and phonophobia during headaches, which occurred last about 2 days ago.  No recent changes in balance or gait are reported.  She drinks about four bottles of water a day but states that she me be dehydrated now. No recent chest pain or dyspnea. She had an echocardiogram on Tuesday which came back normal. She does  have a hx of alpha thalassemia minor (no increased association of stroke) and has not had to get an infusion in some time.      ROS Per HPI     Objective:    BP 115/78   Pulse 90   Temp 97.8 F (36.6 C) (Oral)   Resp 16   Ht 5' 2 (1.575 m)   Wt 168 lb 3.2 oz (76.3 kg)   LMP 08/18/2023 (Approximate)   SpO2 99%   BMI 30.76 kg/m    Physical Exam Constitutional:      General: She is not in acute distress.    Appearance: Normal appearance. She is not ill-appearing, toxic-appearing or diaphoretic.  HENT:     Head: Normocephalic and atraumatic.     Right Ear: Tympanic membrane, ear canal and external ear normal.     Left Ear: Tympanic membrane, ear canal and external ear normal.     Nose: Nose normal.  Eyes:     General: No scleral icterus.    Conjunctiva/sclera: Conjunctivae normal.     Comments: No major nystagmus upon sitting or laying. Normal HINTS Exam.  Cardiovascular:     Rate and Rhythm: Normal rate and regular rhythm.     Heart sounds: Normal heart sounds. No murmur heard.    No friction rub.  Pulmonary:     Effort: Pulmonary effort is normal. No respiratory distress.     Breath sounds: Normal breath sounds. No wheezing, rhonchi or rales.  Musculoskeletal:  General: Normal range of motion.  Skin:    General: Skin is warm and dry.     Coloration: Skin is not jaundiced or pale.  Neurological:     General: No focal deficit present.     Mental Status: She is alert.     Cranial Nerves: No cranial nerve deficit, dysarthria or facial asymmetry.     Sensory: Sensation is intact. No sensory deficit.     Motor: Motor function is intact. No weakness or pronator drift.     Coordination: Coordination is intact. Romberg sign negative. Finger-Nose-Finger Test normal.     Comments: Mental Status:  Alert, oriented, thought content appropriate. Speech fluent without evidence of aphasia. Able to follow 2 step commands without difficulty.   Cranial Nerves:  II:   Peripheral visual fields grossly normal, pupils equal, round, reactive to light III,IV, VI: ptosis not present, extra-ocular motions grossly normal  V,VII: smile symmetric, facial light touch sensation equal VIII: hearing grossly normal bilaterally  IX,X: midline uvula rise  XI: bilateral shoulder shrug equal and strong XII: midline tongue extension   Motor:  5/5 in upper and lower extremities bilaterally including strong and equal grip strength and dorsiflexion/plantar flexion Sensory: Pinprick and light touch normal in all extremities.  Deep Tendon Reflexes: 2+ and symmetric  Cerebellar: normal finger-to-nose with bilateral upper extremities Gait: normal narrow based gait and balance CV: distal pulses palpable throughout    Psychiatric:        Mood and Affect: Mood normal.        Behavior: Behavior normal.    No results found for any visits on 09/27/23.     Assessment & Plan:   Assessment and Plan    Dizziness Undiagnosed new problem with uncertain prognosis. Acute onset of dizziness with a room-spinning sensation since 3 AM, exacerbated by movement or positional changes. She states that it has not been present at the clinic. No tinnitus, but recent earache noted. Differential includes vestibular neuronitis, Meniere's disease, vestibular migraine, and PPPV. Neurological exam normal, HINTS exam normal, no nystagmus or major deficits, Cranial nerves normal, Romberg normal, negative pronator drift. No red flags, but hospital evaluation if symptoms persist and she would like to investigate further with imaging. Meclizine  recommended for symptom management, with caution for drowsiness. - Prescribe meclizine  for dizziness - Advise to monitor symptoms and seek further evaluation if symptoms persist or worsen - Encourage adequate hydration and consumption of a salty meal - Order CBC to rule out underlying infection or anemia due to hx of alpha thalassemia minor  Headache Intermittent  headaches with photophobia and phonophobia, suggestive of migraines. No personal migraine history, but family history present. Headaches absent currently, but vestibular migraines possible given dizziness and light/sound sensitivity. Also possibility of aura (vestibular) without headache component today if other causes are negative. Discussed Advil or Tylenol for management. - Advise to take Advil or Tylenol for headache management - Monitor for development of headaches and consider outpatient management or neurology referral if symptoms persist  General Health Maintenance Discussed hydration and dietary modifications for overall health. Recent echocardiogram normal, awaiting heart monitor results. No atrial fibrillation, but family history noted. Discussed genetic component of atrial fibrillation and importance of regular cardiology follow-up. - Encourage regular follow-up with cardiologist - Advise to drink plenty of water and consider using Liquid IV or similar products for hydration      Return if symptoms worsen or fail to improve.  Melchor Kirchgessner T Tyan Lasure, PA-C

## 2023-09-27 NOTE — Telephone Encounter (Signed)
 FYI Only or Action Required?: FYI only for provider.  Patient was last seen in primary care on 02/07/2023 by Mercer Clotilda SAUNDERS, MD.  Called Nurse Triage reporting Dizziness.  Symptoms began today.  Interventions attempted: Nothing.  Symptoms are: unchanged.  Triage Disposition: See Physician Within 24 Hours  Patient/caregiver understands and will follow disposition?: Yes   Reason for Disposition  [1] MODERATE dizziness (e.g., vertigo; feels very unsteady, interferes with normal activities) AND [2] has NOT been evaluated by doctor (or NP/PA) for this  Answer Assessment - Initial Assessment Questions 1. DESCRIPTION: Describe your dizziness.     Spinning, nausea 2. VERTIGO: Do you feel like either you or the room is spinning or tilting?      Room spinning 3. LIGHTHEADED: Do you feel lightheaded? (e.g., somewhat faint, woozy, weak upon standing)     no 4. SEVERITY: How bad is it?  Can you walk?     yes 5. ONSET:  When did the dizziness begin?     This morning 6. AGGRAVATING FACTORS: Does anything make it worse? (e.g., standing, change in head position)     Laying to standing- now sitting- eyes closed 7. CAUSE: What do you think is causing the dizziness?     Unsure- vertigo 8. RECURRENT SYMPTOM: Have you had dizziness before? If Yes, ask: When was the last time? What happened that time?     Couple time- motion sickness pills help- it has been a few moths since last episode 9. OTHER SYMPTOMS: Do you have any other symptoms? (e.g., earache, headache, numbness, tinnitus, vomiting, weakness)     Earache- earlier this week  Protocols used: Dizziness - Vertigo-A-AH    Copied from CRM [id number]. Topic: Clinical - Red Word Triage >> Sep 27, 2023  8:31 AM Treva T wrote: Red Word that prompted transfer to Nurse Triage: Patient extreme vertigo, dizziness, and nausea. Sudden onset this morning.

## 2023-09-27 NOTE — Patient Instructions (Addendum)
 It was nice to see you today!  As we discussed in clinic:  I suspect that this is a benign form of vertigo or associated with your recent migraine or sickness. All of your neurologic testing was normal today. If you develop any numbness, weakness, or tingling I would direct you to the hospital. I would like for you to make sure you hydrate today and eat a salty meal to see if it is related to dehydration. I will let you know how your blood work comes back as well.  If you have any problems before your next visit feel free to message me via MyChart (minor issues or questions) or call the office, otherwise you may reach out to schedule an office visit.  Thank you! Jeslynn Hollander, PA-C

## 2023-10-08 NOTE — Progress Notes (Signed)
 Chief Complaint: No chief complaint on file.   History of Present Illness:  Judy George is a 43 y.o. female who is seen in consultation from Rothfuss, Lang DASEN, PA-C for evaluation of history of kidney stones as well as pyelonephritis.  She has never passed a kidney stone.  In the fall 2023 she was admitted to [facility] with bilateral pyelonephritis.  She had a prior CT revealing bilateral renal calculi.  That was found during an emergency room visit.  She was having pain at that time.  She was admitted a few days later with pyelonephritis.  She was not passing any stones.  She did have lithotripsy of a renal stone by Dr. Marda following that, however.  She has not had recurrent stone issues since then, she has not had recurrent pyelonephritis.  Most recent CT scan from January of this year revealed bilateral renal calculi, largest stone being in the right lower pole measuring 8 x 5 mm.  She has not had metabolic evaluation for her stone.  She was told that they were calcium.  She has had normal calcium levels the past 3 metabolic panels.  She has also had normal creatinines.   Past Medical History:  Past Medical History:  Diagnosis Date   AKI (acute kidney injury) (HCC) 03/14/2023   Alpha+ thalassemia (HCC)    Anemia    Asymptomatic bacteriuria 03/14/2023   BV (bacterial vaginosis) 03/14/2023   Chronic constipation 06/05/2016   Diastasis recti 11/30/2015   History of colon polyps    History of irregular heartbeat 06/05/2016   Kidney stones    Ovarian cyst    right    Past Surgical History:  Past Surgical History:  Procedure Laterality Date   CESAREAN SECTION     X3   COLONOSCOPY  2016   Dr CHRISTELLA in [place] removed and it was non cancerous    HERNIA REPAIR  04/2013   TUBAL LIGATION      Allergies:  No Known Allergies  Family History:  Family History  Problem Relation Age of Onset   Hypertension Mother    Colon polyps Mother    Atrial fibrillation  Mother        surgery   Diabetes Maternal Aunt    Heart attack Maternal Aunt    Kidney Stones Maternal Grandmother    Asthma Maternal Grandmother    Hyperlipidemia Maternal Grandmother    Hypertension Maternal Grandmother    Kidney disease Maternal Grandmother    Heart disease Maternal Grandfather    Heart attack Maternal Grandfather    Hypertension Maternal Grandfather    Hyperlipidemia Maternal Grandfather    Colon cancer Neg Hx    Esophageal cancer Neg Hx    Rectal cancer Neg Hx    Stomach cancer Neg Hx     Social History:  Social History   Tobacco Use   Smoking status: Never    Passive exposure: Never   Smokeless tobacco: Never  Vaping Use   Vaping status: Never Used  Substance Use Topics   Alcohol use: Never   Drug use: Never    Review of symptoms:  Constitutional:  Negative for unexplained weight loss, night sweats, fever, chills ENT:  Negative for nose bleeds, sinus pain, painful swallowing CV:  Negative for chest pain, shortness of breath, exercise intolerance, palpitations, loss of consciousness Resp:  Negative for cough, wheezing, shortness of breath GI:  Negative for nausea, vomiting, diarrhea, bloody stools GU:  Positives noted in  HPI; otherwise negative for gross hematuria, dysuria, urinary incontinence Neuro:  Negative for seizures, poor balance, limb weakness, slurred speech Psych:  Negative for lack of energy, depression, anxiety Endocrine:  Negative for polydipsia, polyuria, symptoms of hypoglycemia (dizziness, hunger, sweating) Hematologic:  Negative for anemia, purpura, petechia, prolonged or excessive bleeding, use of anticoagulants  Allergic:  Negative for difficulty breathing or choking as a result of exposure to anything; no shellfish allergy; no allergic response (rash/itch) to materials, foods  Physical exam: LMP 08/18/2023 (Approximate)  GENERAL APPEARANCE:  Well appearing, well developed, well nourished, NAD HEENT: Atraumatic,  Normocephalic. NECK: Normal appearance LUNGS: Normal inspiratory and expiratory excursion HEART: Regular Rate ABDOMEN: No CVA tenderness EXTREMITIES: Moves all extremities well.  Without clubbing, cyanosis, or edema. NEUROLOGIC:  Alert and oriented x 3, normal gait, CN II-XII grossly intact.  MENTAL STATUS:  Appropriate. SKIN:  Warm, dry and intact.    Results: I have reviewed referring/prior physicians records  I have reviewed urinalysis--clear today  I reviewed prior imaging studies--CT images reviewed  Hospital discharge record from 2023 reviewed    residual urine volume today 55 mL (very large bladder capacity seen on CT)  Assessment: -History of pyelonephritis, undetermined etiology.  That was 2 years ago.  No recurrence since then, no longstanding history of infections before then  -Bilateral renal stones, currently asymptomatic   Plan: -I will send her out with a 24-hour urine order  -I will let her know results of this as well as any suggestions for dietary modification  -I will have her come back in 6 months with KUB

## 2023-10-09 ENCOUNTER — Ambulatory Visit: Admitting: Urology

## 2023-10-09 VITALS — BP 130/84 | HR 89 | Ht 62.0 in | Wt 168.0 lb

## 2023-10-09 DIAGNOSIS — Z87448 Personal history of other diseases of urinary system: Secondary | ICD-10-CM | POA: Diagnosis not present

## 2023-10-09 DIAGNOSIS — N200 Calculus of kidney: Secondary | ICD-10-CM | POA: Diagnosis not present

## 2023-10-09 DIAGNOSIS — Z87442 Personal history of urinary calculi: Secondary | ICD-10-CM

## 2023-10-09 LAB — URINALYSIS, ROUTINE W REFLEX MICROSCOPIC
Bilirubin, UA: NEGATIVE
Glucose, UA: NEGATIVE
Ketones, UA: NEGATIVE
Leukocytes,UA: NEGATIVE
Nitrite, UA: NEGATIVE
Protein,UA: NEGATIVE
Specific Gravity, UA: 1.02 (ref 1.005–1.030)
Urobilinogen, Ur: 0.2 mg/dL (ref 0.2–1.0)
pH, UA: 6 (ref 5.0–7.5)

## 2023-10-09 LAB — MICROSCOPIC EXAMINATION: Bacteria, UA: NONE SEEN

## 2023-10-09 LAB — BLADDER SCAN AMB NON-IMAGING: Scan Result: 55

## 2023-10-11 ENCOUNTER — Encounter: Payer: Self-pay | Admitting: Urology

## 2023-10-11 ENCOUNTER — Ambulatory Visit: Admitting: Gastroenterology

## 2023-10-11 ENCOUNTER — Encounter: Payer: Self-pay | Admitting: Gastroenterology

## 2023-10-11 VITALS — BP 127/71 | HR 69 | Temp 98.1°F | Resp 12 | Ht 62.0 in | Wt 168.0 lb

## 2023-10-11 DIAGNOSIS — K581 Irritable bowel syndrome with constipation: Secondary | ICD-10-CM

## 2023-10-11 DIAGNOSIS — K640 First degree hemorrhoids: Secondary | ICD-10-CM

## 2023-10-11 DIAGNOSIS — Z1211 Encounter for screening for malignant neoplasm of colon: Secondary | ICD-10-CM | POA: Diagnosis present

## 2023-10-11 DIAGNOSIS — Q438 Other specified congenital malformations of intestine: Secondary | ICD-10-CM

## 2023-10-11 MED ORDER — SODIUM CHLORIDE 0.9 % IV SOLN: 500.0000 mL | Freq: Once | INTRAVENOUS | Status: DC

## 2023-10-11 NOTE — Progress Notes (Signed)
 Patient stated Cardiologist office message that results was ok.

## 2023-10-11 NOTE — Progress Notes (Signed)
 Pt's states no medical or surgical changes since previsit or office visit.

## 2023-10-11 NOTE — Op Note (Signed)
 [facility] Patient Name: Judy George Procedure Date: 10/11/2023 10:24 AM MRN: [id number] Endoscopist: Lynnie Bring , MD, [id number] Age: 43 Referring MD:  Date of Birth: Jun 07, 1980 Gender: Female Account #: [id number] Procedure:                Colonoscopy Indications:              Screening for colorectal malignant neoplasm. Medicines:                Monitored Anesthesia Care Procedure:                Pre-Anesthesia Assessment:                           - Prior to the procedure, a History and Physical                            was performed, and patient medications and                            allergies were reviewed. The patient's tolerance of                            previous anesthesia was also reviewed. The risks                            and benefits of the procedure and the sedation                            options and risks were discussed with the patient.                            All questions were answered, and informed consent                            was obtained. Prior Anticoagulants: The patient has                            taken no anticoagulant or antiplatelet agents. ASA                            Grade Assessment: II - A patient with mild systemic                            disease. After reviewing the risks and benefits,                            the patient was deemed in satisfactory condition to                            undergo the procedure.                           After obtaining informed consent, the colonoscope  was passed under direct vision. Throughout the                            procedure, the patient's blood pressure, pulse, and                            oxygen saturations were monitored continuously. The                            PCF-HQ190L Colonoscope [id number] was introduced                            through the anus and advanced to the 2 cm into the                            ileum. The  colonoscopy was performed without                            difficulty. The patient tolerated the procedure                            well. The quality of the bowel preparation was                            good. The terminal ileum, ileocecal valve,                            appendiceal orifice, and rectum were photographed. Scope In: 10:30:18 AM Scope Out: 10:46:01 AM Scope Withdrawal Time: 0 hours 9 minutes 0 seconds  Total Procedure Duration: 0 hours 15 minutes 43 seconds  Findings:                 The colon (entire examined portion) was somewhat                            torturous/redundant but normal.                           Non-bleeding internal hemorrhoids were found during                            retroflexion. The hemorrhoids were small and Grade                            I (internal hemorrhoids that do not prolapse).                           The terminal ileum appeared normal.                           The exam was otherwise without abnormality on                            direct and retroflexion views. Complications:  No immediate complications. Estimated Blood Loss:     Estimated blood loss: none. Impression:               - The entire examined colon is normal.                           - Non-bleeding internal hemorrhoids.                           - The examined portion of the ileum was normal.                           - The examination was otherwise normal on direct                            and retroflexion views.                           - No specimens collected. Recommendation:           - Patient has a contact number available for                            emergencies. The signs and symptoms of potential                            delayed complications were discussed with the                            patient. Return to normal activities tomorrow.                            Written discharge instructions were provided to the                             patient.                           - Resume previous diet.                           - Continue present medications.                           - Repeat colonoscopy in 10 years for screening                            purposes. Earlier, with any new problems or change                            in family history.                           - The findings and recommendations were discussed                            with the patient's family.  Lynnie Bring, MD 10/11/2023 10:51:12 AM This report has been signed electronically.

## 2023-10-11 NOTE — Patient Instructions (Addendum)
 Handout on hemorrhoids given to patient  Resume previous diet and continue present medications  Repeat colonoscopy in 10 years for surveillance. Earlier, with any new problems or change in family history.   YOU HAD AN ENDOSCOPIC PROCEDURE TODAY AT THE [facility]:   Refer to the procedure report that was given to you for any specific questions about what was found during the examination.  If the procedure report does not answer your questions, please call your gastroenterologist to clarify.  If you requested that your care partner not be given the details of your procedure findings, then the procedure report has been included in a sealed envelope for you to review at your convenience later.  YOU SHOULD EXPECT: Some feelings of bloating in the abdomen. Passage of more gas than usual.  Walking can help get rid of the air that was put into your GI tract during the procedure and reduce the bloating. If you had a lower endoscopy (such as a colonoscopy or flexible sigmoidoscopy) you may notice spotting of blood in your stool or on the toilet paper. If you underwent a bowel prep for your procedure, you may not have a normal bowel movement for a few days.  Please Note:  You might notice some irritation and congestion in your nose or some drainage.  This is from the oxygen used during your procedure.  There is no need for concern and it should clear up in a day or so.  SYMPTOMS TO REPORT IMMEDIATELY:  Following lower endoscopy (colonoscopy or flexible sigmoidoscopy):  Excessive amounts of blood in the stool  Significant tenderness or worsening of abdominal pains  Swelling of the abdomen that is new, acute  Fever of 100F or higher  For urgent or emergent issues, a gastroenterologist can be reached at any hour by calling [phone]. Do not use MyChart messaging for urgent concerns.    DIET:  We do recommend a small meal at first, but then you may proceed to your regular diet.  Drink  plenty of fluids but you should avoid alcoholic beverages for 24 hours.  ACTIVITY:  You should plan to take it easy for the rest of today and you should NOT DRIVE or use heavy machinery until tomorrow (because of the sedation medicines used during the test).    FOLLOW UP: Our staff will call the number listed on your records the next business day following your procedure.  We will call around 7:15- 8:00 am to check on you and address any questions or concerns that you may have regarding the information given to you following your procedure. If we do not reach you, we will leave a message.     If any biopsies were taken you will be contacted by phone or by letter within the next 1-3 weeks.  Please call us  at [phone] if you have not heard about the biopsies in 3 weeks.    SIGNATURES/CONFIDENTIALITY: You and/or your care partner have signed paperwork which will be entered into your electronic medical record.  These signatures attest to the fact that that the information above on your After Visit Summary has been reviewed and is understood.  Full responsibility of the confidentiality of this discharge information lies with you and/or your care-partner.

## 2023-10-11 NOTE — Progress Notes (Signed)
 A/o x 3, VSS, gd SR's, pleased with anesthesia, report to RN

## 2023-10-14 ENCOUNTER — Ambulatory Visit (HOSPITAL_BASED_OUTPATIENT_CLINIC_OR_DEPARTMENT_OTHER): Admitting: Student

## 2023-10-18 ENCOUNTER — Telehealth: Payer: Self-pay | Admitting: Cardiology

## 2023-10-18 NOTE — Telephone Encounter (Signed)
 Patient states she is returning call. Please advise

## 2023-10-18 NOTE — Telephone Encounter (Signed)
 Pt needs a [facility] visit with Dr. Sheena.

## 2023-10-21 ENCOUNTER — Other Ambulatory Visit: Payer: Self-pay | Admitting: Urology

## 2023-10-23 ENCOUNTER — Ambulatory Visit (HOSPITAL_BASED_OUTPATIENT_CLINIC_OR_DEPARTMENT_OTHER): Payer: 59 | Admitting: Student

## 2023-10-25 ENCOUNTER — Encounter (HOSPITAL_BASED_OUTPATIENT_CLINIC_OR_DEPARTMENT_OTHER): Payer: Self-pay

## 2023-10-25 ENCOUNTER — Other Ambulatory Visit (HOSPITAL_BASED_OUTPATIENT_CLINIC_OR_DEPARTMENT_OTHER): Payer: Self-pay | Admitting: Student

## 2023-10-25 DIAGNOSIS — L700 Acne vulgaris: Secondary | ICD-10-CM

## 2023-10-25 MED ORDER — ADAPALENE 0.3 % EX GEL: 1.0000 | Freq: Every day | CUTANEOUS | 5 refills | Status: DC

## 2023-10-26 LAB — LITHOLINK 24HR URINE PANEL
Ammonium, Urine: 45 mmol/(24.h) (ref 15–60)
Calcium Oxalate Saturation: 3.94 — ABNORMAL LOW (ref 6.00–10.00)
Calcium Phosphate Saturation: 0.05 — ABNORMAL LOW (ref 0.50–2.00)
Calcium, Urine: 67 mg/(24.h) (ref <200)
Calcium/Creatinine Ratio: 53 mg/g{creat} (ref 51–262)
Calcium/Kg Body Weight: 0.9 mg/kg/d (ref <4.0)
Chloride, Urine: 82 mmol/(24.h) (ref 70–250)
Citrate, Urine: 852 mg/(24.h) (ref >550)
Creatinine, Urine: 1271 mg/(24.h)
Creatinine/Kg Body Weight: 16.5 mg/kg/d (ref 8.7–20.3)
Cystine, Urine, Qualitative: NEGATIVE
Magnesium, Urine: 42 mg/(24.h) (ref 30–120)
Oxalate, Urine: 56 mg/(24.h) — ABNORMAL HIGH (ref 20–40)
Phosphorus, Urine: 492 mg/(24.h) — ABNORMAL LOW (ref 600–1200)
Potassium, Urine: 45 mmol/(24.h) (ref 20–100)
Protein Catabolic Rate: 0.7 g/kg/d — ABNORMAL LOW (ref 0.8–1.4)
Sodium, Urine: 62 mmol/(24.h) (ref 50–150)
Sulfate, Urine: 24 meq/(24.h) (ref 20–80)
Urea Nitrogen, Urine: 5.99 g/(24.h) — ABNORMAL LOW (ref 6.00–14.00)
Uric Acid Saturation: 0.57 (ref <1.00)
Uric Acid, Urine: 400 mg/(24.h) (ref <750)
Urine Volume (Preserved): 2910 mL/(24.h) (ref 500–4000)
pH, 24 hr, Urine: 5.605 — ABNORMAL LOW (ref 5.800–6.200)

## 2023-10-29 ENCOUNTER — Encounter (HOSPITAL_BASED_OUTPATIENT_CLINIC_OR_DEPARTMENT_OTHER): Payer: Self-pay

## 2023-10-29 ENCOUNTER — Ambulatory Visit: Payer: Self-pay | Admitting: Urology

## 2023-11-05 NOTE — Telephone Encounter (Signed)
 Appt made.

## 2023-11-06 ENCOUNTER — Ambulatory Visit: Attending: Cardiology | Admitting: Cardiology

## 2023-11-06 ENCOUNTER — Encounter: Payer: Self-pay | Admitting: Cardiology

## 2023-11-06 VITALS — Ht 62.0 in | Wt 168.0 lb

## 2023-11-06 DIAGNOSIS — Q210 Ventricular septal defect: Secondary | ICD-10-CM | POA: Diagnosis not present

## 2023-11-06 DIAGNOSIS — I4729 Other ventricular tachycardia: Secondary | ICD-10-CM | POA: Diagnosis not present

## 2023-11-06 DIAGNOSIS — I471 Supraventricular tachycardia, unspecified: Secondary | ICD-10-CM

## 2023-11-06 MED ORDER — METOPROLOL SUCCINATE ER 25 MG PO TB24: 12.5000 mg | ORAL_TABLET | Freq: Every day | ORAL | 3 refills | Status: AC

## 2023-11-06 NOTE — Patient Instructions (Signed)
 Medication Instructions:  Your physician has recommended you make the following change in your medication:  START: Toprol -XL 12.5 mg once daily  *If you need a refill on your cardiac medications before your next appointment, please call your pharmacy*   Follow-Up: At [facility], you and your health needs are our priority.  As part of our continuing mission to provide you with exceptional heart care, our providers are all part of one team.  This team includes your primary Cardiologist (physician) and Advanced Practice Providers or APPs (Physician Assistants and Nurse Practitioners) who all work together to provide you with the care you need, when you need it.  Your next appointment:   9 month(s)  Provider:   Kardie Tobb, DO

## 2023-11-06 NOTE — Progress Notes (Signed)
 Virtual Visit via Video Note   Because of Judy George's co-morbid illnesses, she is at least at moderate risk for complications without adequate follow up.  This format is felt to be most appropriate for this patient at this time.  All issues noted in this document were discussed and addressed.  A limited physical exam was performed with this format.  Please refer to the patient's chart for her consent to telehealth for [facility].      Date:  11/09/2023   ID:  Judy George, Judy George July 11, 1980, MRN [id number]  Patient Location: Home Provider Location: Office/Clinic  PCP:  Iven Lang DASEN, PA-C  Cardiologist:  Deshaun Schou, DO  Electrophysiologist:  None   Evaluation Performed:  Follow-Up Visit  Chief Complaint:   I am doing well   History of Present Illness:    Judy George is a 43 y.o. female with small muscular ventricular septal defect, NSVT, here today for follow-up visit.  At her most recent visit with me she reported that she had been experiencing intermittent shortness of breath as well as palpitations.  She was concerned about this.  I placed a monitor on the patient as well as got an echocardiogram.  She is here to discuss her monitor result.  The patient does not have symptoms concerning for COVID-19 infection (fever, chills, cough, or new shortness of breath).    Past Medical History:  Diagnosis Date   AKI (acute kidney injury) (HCC) 03/14/2023   Alpha+ thalassemia (HCC)    Anemia    Asymptomatic bacteriuria 03/14/2023   BV (bacterial vaginosis) 03/14/2023   Chronic constipation 06/05/2016   Diastasis recti 11/30/2015   History of colon polyps    History of irregular heartbeat 06/05/2016   Kidney stones    Ovarian cyst    right   Past Surgical History:  Procedure Laterality Date   CESAREAN SECTION     X3   COLONOSCOPY  2016   Dr CHRISTELLA in [place] 1 removed and it was non cancerous    HERNIA REPAIR  04/2013   TUBAL LIGATION        Current Meds  Medication Sig   acetaminophen (TYLENOL) 500 MG tablet Take 1,000 mg by mouth as needed.   Adapalene  0.3 % gel Apply 1 Application topically at bedtime. Apply to entire face rather than spot treatment.   FIBER ADULT GUMMIES PO Take by mouth. Take 3 gummies daily   linaclotide  (LINZESS ) 290 MCG CAPS capsule TAKE 1 CAPSULE BY MOUTH ONCE DAILY BEFORE BREAKFAST   meclizine  (ANTIVERT ) 25 MG tablet Take 1 tablet (25 mg total) by mouth 3 (three) times daily as needed for dizziness.   metoprolol  succinate (TOPROL  XL) 25 MG 24 hr tablet Take 0.5 tablets (12.5 mg total) by mouth daily.   pantoprazole  (PROTONIX ) 40 MG tablet Take 40 mg by mouth as needed.   polyethylene glycol powder (MIRALAX) 17 GM/SCOOP powder Take by mouth as needed.     Allergies:   Patient has no known allergies.   Social History   Tobacco Use   Smoking status: Never    Passive exposure: Never   Smokeless tobacco: Never  Vaping Use   Vaping status: Never Used  Substance Use Topics   Alcohol use: Never   Drug use: Never     Family Hx: The patient's family history includes Asthma in her maternal grandmother; Atrial fibrillation in her mother; Colon polyps in her mother; Diabetes in her maternal aunt; Heart  attack in her maternal aunt and maternal grandfather; Heart disease in her maternal grandfather; Hyperlipidemia in her maternal grandfather and maternal grandmother; Hypertension in her maternal grandfather, maternal grandmother, and mother; Kidney Stones in her maternal grandmother; Kidney disease in her maternal grandmother. There is no history of Colon cancer, Esophageal cancer, Rectal cancer, or Stomach cancer.  ROS:   Please see the history of present illness.     All other systems reviewed and are negative.   Prior CV studies:   The following studies were reviewed today:  Echo and zio monitor   Labs/Other Tests and Data Reviewed:    EKG:  No ECG reviewed.  Recent Labs: 02/07/2023:  Magnesium 1.8 04/25/2023: ALT 10; BUN 5; Creatinine, Ser 0.58; Potassium 4.9; Sodium 141 07/08/2023: TSH 0.965 09/27/2023: Hemoglobin 12.1; Platelets 315   Recent Lipid Panel Lab Results  Component Value Date/Time   CHOL 178 09/23/2023 08:56 AM   TRIG 34 09/23/2023 08:56 AM   HDL 69 09/23/2023 08:56 AM   CHOLHDL 2.6 09/23/2023 08:56 AM   CHOLHDL 3 04/09/2022 09:39 AM   LDLCALC 102 (H) 09/23/2023 08:56 AM    Wt Readings from Last 3 Encounters:  11/06/23 168 lb (76.2 kg)  10/11/23 168 lb (76.2 kg)  10/09/23 168 lb (76.2 kg)     Objective:    Vital Signs:  Ht 5' 2 (1.575 m)   Wt 168 lb (76.2 kg)   BMI 30.73 kg/m      ASSESSMENT & PLAN:    NSVT PSVT Small muscular VSD  We discussed her monitor results. Shared decision it will be of great benefit to start low dose AV nodal blocker given symptoms. Toprol  xl 12.5 mg at night time.   COVID-19 Education: The signs and symptoms of COVID-19 were discussed with the patient and how to seek care for testing (follow up with PCP or arrange E-visit).  The importance of social distancing was discussed today.  Time:   Today, I have spent 15 minutes with the patient with telehealth technology discussing the above problems.     Medication Adjustments/Labs and Tests Ordered: Current medicines are reviewed at length with the patient today.  Concerns regarding medicines are outlined above.   Tests Ordered: No orders of the defined types were placed in this encounter.   Medication Changes: Meds ordered this encounter  Medications   metoprolol  succinate (TOPROL  XL) 25 MG 24 hr tablet    Sig: Take 0.5 tablets (12.5 mg total) by mouth daily.    Dispense:  45 tablet    Refill:  3    Follow Up:  In Person in 69 month(s)  Signed, Charlisha Market, DO  11/09/2023 6:57 PM    [facility]

## 2023-12-23 ENCOUNTER — Ambulatory Visit (HOSPITAL_BASED_OUTPATIENT_CLINIC_OR_DEPARTMENT_OTHER): Admitting: Student

## 2023-12-23 ENCOUNTER — Encounter (HOSPITAL_BASED_OUTPATIENT_CLINIC_OR_DEPARTMENT_OTHER): Payer: Self-pay | Admitting: Student

## 2023-12-23 VITALS — BP 104/68 | HR 69 | Temp 98.4°F | Resp 16 | Ht 62.0 in | Wt 175.8 lb

## 2023-12-23 DIAGNOSIS — M25561 Pain in right knee: Secondary | ICD-10-CM

## 2023-12-23 DIAGNOSIS — E66811 Obesity, class 1: Secondary | ICD-10-CM

## 2023-12-23 DIAGNOSIS — Z6832 Body mass index (BMI) 32.0-32.9, adult: Secondary | ICD-10-CM

## 2023-12-23 DIAGNOSIS — E6609 Other obesity due to excess calories: Secondary | ICD-10-CM | POA: Diagnosis not present

## 2023-12-23 MED ORDER — MELOXICAM 15 MG PO TABS: 15.0000 mg | ORAL_TABLET | Freq: Every day | ORAL | 0 refills | Status: DC

## 2023-12-23 NOTE — Progress Notes (Signed)
 Established Patient Office Visit  Subjective   Patient ID: Judy George, female    DOB: 20-Sep-1980  Age: 43 y.o. MRN: [id number]  Chief Complaint  Patient presents with   Medical Management of Chronic Issues    follow up    Weight Loss    Would like to re-discuss weight loss. Phentermine  stalled out. Feels like she is over snacking due to being on the go at work. Has not taken phentermine  since last visit (09/27/2023).    Knee Pain    Right knee is hurting right below knee bone. Husband thinks she might have torn meniscus. Hurts more when she stands after sitting for a while. Feels like it stiffens up.    Discussed the use of AI scribe software for clinical note transcription with the patient, who gave verbal consent to proceed.  History of Present Illness   Judy George is a 43 year old female who presents with knee pain and weight management concerns.  She has been experiencing knee pain for a few weeks, described as stiffness and pain located underneath the knee, primarily on the medial side, sometimes radiating to the back- essentially under the patella. The pain worsens when stepping up stairs, and she needed assistance from her son after attending a softball game due to severe pain. She denies any specific injury but recalls the pain starting suddenly. She experiences pain even while lying in bed at night. There is subjective swelling in the knee and a popping sensation, which the patient reports occurs without pain.  She is concerned about weight management and is interested in exploring [phone] for weight loss, as her mother is currently on it. She has not yet contacted her insurance to check coverage for [facility] and is considering other options like Contrave if Georjean is not covered. She wants to get healthy, especially since she has four children.      Patient Active Problem List   Diagnosis Date Noted   Nonintractable episodic headache 09/27/2023   Vertigo  09/27/2023   Acne vulgaris 09/23/2023   Xerosis cutis 07/08/2023   Perimenopausal symptoms 07/08/2023   PCOS (polycystic ovarian syndrome) 07/08/2023   Calculus of kidney 03/14/2023   Reactive airway disease 03/14/2023   Hx of renal calculi 03/14/2023   Iron deficiency anemia due to chronic blood loss 10/05/2022   History of PCOS 09/17/2022   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   History of colon polyps    Alpha+ thalassemia    Thalassemia minor 07/12/2019   Class 1 obesity due to excess calories without serious comorbidity with body mass index (BMI) of 31.0 to 31.9 in adult 06/04/2019   History of irregular heartbeat 06/05/2016   Ventral hernia 11/30/2015   Past Medical History:  Diagnosis Date   AKI (acute kidney injury) 03/14/2023   Alpha+ thalassemia    Anemia    Asymptomatic bacteriuria 03/14/2023   BV (bacterial vaginosis) 03/14/2023   Chronic constipation 06/05/2016   Diastasis recti 11/30/2015   History of colon polyps    History of irregular heartbeat 06/05/2016   Kidney stones    Ovarian cyst    right   Social History   Tobacco Use   Smoking status: Never    Passive exposure: Never   Smokeless tobacco: Never  Vaping Use   Vaping status: Never Used  Substance Use Topics   Alcohol use: Never   Drug use: Never   No Known Allergies  ROS Per HPI.    Objective:     BP 104/68   Pulse 69   Temp 98.4 F (36.9 C) (Oral)   Resp 16   Ht 5' 2 (1.575 m)   Wt 175 lb 12.8 oz (79.7 kg)   LMP 11/25/2023 (Approximate)   SpO2 98%   BMI 32.15 kg/m  BP Readings from Last 3 Encounters:  12/23/23 104/68  10/11/23 127/71  10/09/23 130/84   Wt Readings from Last 3 Encounters:  12/23/23 175 lb 12.8 oz (79.7 kg)  11/06/23 168 lb (76.2 kg)  10/11/23 168 lb (76.2 kg)    Physical Exam Constitutional:      General: She is not in acute distress.    Appearance: Normal appearance. She is not  ill-appearing.  HENT:     Head: Normocephalic and atraumatic.     Nose: Nose normal.  Eyes:     General: No scleral icterus.    Conjunctiva/sclera: Conjunctivae normal.  Cardiovascular:     Rate and Rhythm: Normal rate.  Pulmonary:     Effort: Pulmonary effort is normal.  Musculoskeletal:        General: Normal range of motion.     Comments: R Knee: Normal to inspection with no erythema or obvious bony abnormalities.  Palpation normal with no warmth or condyle tenderness. She did have tenderness to generalized anterior knee, both medial and lateral to the patellar tendon but not tender over patellar tendon. ROM full in flexion and extension. Ligaments with solid endpoints including ACL, PCL, LCL, MCL- no pain to varus stress, valgus stress, anterior drawer, or posterior drawer. Negative Mcmurray's and mild positive Thessaly of R knee. Non painful patellar compression. Patellar glide with crepitus. Patellar and quadriceps tendons unremarkable. Hamstring and quadriceps strength is normal.   Skin:    General: Skin is warm and dry.     Coloration: Skin is not jaundiced or pale.  Neurological:     General: No focal deficit present.     Mental Status: She is alert.  Psychiatric:        Mood and Affect: Mood normal.        Behavior: Behavior normal.    No results found for any visits on 12/23/23.  Last CBC Lab Results  Component Value Date   WBC 6.1 09/27/2023   HGB 12.1 09/27/2023   HCT 40.6 09/27/2023   MCV 75 (L) 09/27/2023   MCH 22.2 (L) 09/27/2023   RDW 14.1 09/27/2023   PLT 315 09/27/2023   Last metabolic panel Lab Results  Component Value Date   GLUCOSE 93 04/25/2023   NA 141 04/25/2023   K 4.9 04/25/2023   CL 102 04/25/2023   CO2 22 04/25/2023   BUN 5 (L) 04/25/2023   CREATININE 0.58 04/25/2023   EGFR 114 04/25/2023   CALCIUM 9.8 04/25/2023   PROT 7.7 04/25/2023   ALBUMIN 4.4 04/25/2023   LABGLOB 3.3 04/25/2023   AGRATIO 1.5 10/23/2019   BILITOT 0.2  04/25/2023   ALKPHOS 66 04/25/2023   AST 16 04/25/2023   ALT 10 04/25/2023   Last lipids Lab Results  Component Value Date   CHOL 178 09/23/2023   HDL 69 09/23/2023   LDLCALC 102 (H) 09/23/2023   TRIG 34 09/23/2023   CHOLHDL 2.6 09/23/2023   Last hemoglobin A1c Lab Results  Component Value Date   HGBA1C 5.6 04/25/2023      The 10-year ASCVD risk score (Arnett DK, et al., 2019) is: 0.2%    Assessment &  Plan:   Assessment and Plan    Right knee pain Acute right knee pain for a few weeks, poorly localized to the medial and lateral sides, with some pain in the back of the knee. Pain is exacerbated by stepping up and is poorly localized, suggesting possible patellofemoral syndrome or meniscal injury (mild positive thessaly). Had some crepitus during flexion/extension. Ligaments appear intact. No strong positive signs for meniscal injury, but it remains a possibility. Conservative management is appropriate at this time. - Prescribe meloxicam for two weeks for inflammation control. If needed for longer, she will message the provider. - Advise icing the knee and performing patellofemoral exercises. - Provide a link for patellofemoral exercises. - Order x-ray of the right knee. - Plan for conservative management for six weeks. If no improvement, consider MRI of the knee. - Set follow-up appointment in six weeks.  Obesity Discussed weight loss options. She is interested in Pierre, which may have cardiovascular benefits. Insurance coverage for Georjean is uncertain due to recent changes in insurance provider. Contrave is another potential option if Georjean is not covered. - Instruct her to contact insurance to check coverage for [facility] and under what circumstances it is covered. - If Georjean is not covered, consider Contrave pending insurance approval. - If neither medication is covered, refer to weight loss clinic run by [facility].    I personally spent a total of 47 minutes in the  care of the patient today including preparing to see the patient, getting/reviewing separately obtained history, performing a medically appropriate exam/evaluation, counseling and educating, placing orders, and documenting clinical information in the EHR.   Return in about 6 weeks (around 02/03/2024), or if symptoms worsen or fail to improve, for Right knee pain.    Domique Clapper T Hatley Henegar, PA-C

## 2023-12-23 NOTE — Patient Instructions (Addendum)
 It was nice to see you today!  [email or web address]  If you have any problems before your next visit feel free to message me via MyChart (minor issues or questions) or call the office, otherwise you may reach out to schedule an office visit.  Thank you! Roosevelt Eimers, PA-C

## 2023-12-26 ENCOUNTER — Encounter (HOSPITAL_BASED_OUTPATIENT_CLINIC_OR_DEPARTMENT_OTHER): Payer: Self-pay | Admitting: Student

## 2023-12-28 ENCOUNTER — Ambulatory Visit (HOSPITAL_BASED_OUTPATIENT_CLINIC_OR_DEPARTMENT_OTHER)
Admission: RE | Admit: 2023-12-28 | Discharge: 2023-12-28 | Disposition: A | Source: Ambulatory Visit | Attending: Student | Admitting: Radiology

## 2023-12-28 DIAGNOSIS — M25561 Pain in right knee: Secondary | ICD-10-CM | POA: Diagnosis not present

## 2023-12-30 ENCOUNTER — Ambulatory Visit (HOSPITAL_BASED_OUTPATIENT_CLINIC_OR_DEPARTMENT_OTHER): Payer: Self-pay | Admitting: Student

## 2023-12-31 ENCOUNTER — Other Ambulatory Visit (HOSPITAL_BASED_OUTPATIENT_CLINIC_OR_DEPARTMENT_OTHER): Payer: Self-pay | Admitting: Student

## 2023-12-31 DIAGNOSIS — R110 Nausea: Secondary | ICD-10-CM

## 2023-12-31 MED ORDER — ONDANSETRON 4 MG PO TBDP: 4.0000 mg | ORAL_TABLET | Freq: Three times a day (TID) | ORAL | 0 refills | Status: AC | PRN

## 2024-01-01 ENCOUNTER — Other Ambulatory Visit (HOSPITAL_BASED_OUTPATIENT_CLINIC_OR_DEPARTMENT_OTHER): Payer: Self-pay | Admitting: Student

## 2024-01-01 DIAGNOSIS — E6609 Other obesity due to excess calories: Secondary | ICD-10-CM

## 2024-01-01 MED ORDER — NALTREXONE-BUPROPION HCL ER 8-90 MG PO TB12: ORAL_TABLET | ORAL | 3 refills | Status: DC

## 2024-01-22 ENCOUNTER — Other Ambulatory Visit: Payer: Self-pay | Admitting: Oncology

## 2024-01-22 DIAGNOSIS — D500 Iron deficiency anemia secondary to blood loss (chronic): Secondary | ICD-10-CM

## 2024-01-22 NOTE — Progress Notes (Unsigned)
 [facility]  [address] [place],  [place]  [address] [phone]  Clinic Day: 07/18/2023  Referring physician: Iven Lang DASEN, PA-C   HISTORY OF PRESENT ILLNESS:  The patient is a 43 y.o. female  with microcytic anemia secondary to both iron deficiency anemia and alpha thalassemia minor (@-/@-).  She was last given IV iron in July 2024.  She comes in to reassess her iron and hemoglobin levels.  Overall, she claims to be doing fine.  She does have occasional episodes of fatigue.  She claims her menstrual cycles have not been particularly heavy.  She denies having other overt forms of blood loss.  At the time her alpha thalassemia was diagnosed in May 2018, her hemoglobin was 11.6.   PHYSICAL EXAM:  There were no vitals taken for this visit. Wt Readings from Last 3 Encounters:  12/23/23 175 lb 12.8 oz (79.7 kg)  11/06/23 168 lb (76.2 kg)  10/11/23 168 lb (76.2 kg)   There is no height or weight on file to calculate BMI. Performance status (ECOG): 0 - Asymptomatic Physical Exam Constitutional:      Appearance: Normal appearance. She is not ill-appearing.  HENT:     Mouth/Throat:     Mouth: Mucous membranes are moist.     Pharynx: Oropharynx is clear. No oropharyngeal exudate or posterior oropharyngeal erythema.  Cardiovascular:     Rate and Rhythm: Normal rate and regular rhythm.     Heart sounds: No murmur heard.    No friction rub. No gallop.  Pulmonary:     Effort: Pulmonary effort is normal. No respiratory distress.     Breath sounds: Normal breath sounds. No wheezing, rhonchi or rales.  Abdominal:     General: Bowel sounds are normal. There is no distension.     Palpations: Abdomen is soft. There is no mass.     Tenderness: There is no abdominal tenderness.  Musculoskeletal:        General: No swelling.     Right lower leg: No edema.     Left lower leg: No edema.  Lymphadenopathy:     Cervical: No cervical adenopathy.     Upper  Body:     Right upper body: No supraclavicular or axillary adenopathy.     Left upper body: No supraclavicular or axillary adenopathy.     Lower Body: No right inguinal adenopathy. No left inguinal adenopathy.  Skin:    General: Skin is warm.     Coloration: Skin is not jaundiced.     Findings: No lesion or rash.  Neurological:     General: No focal deficit present.     Mental Status: She is alert and oriented to person, place, and time. Mental status is at baseline.  Psychiatric:        Mood and Affect: Mood normal.        Behavior: Behavior normal.        Thought Content: Thought content normal.    LABS:      Latest Ref Rng & Units 09/27/2023   10:55 AM 07/18/2023    2:24 PM 04/25/2023    4:25 PM  CBC  WBC 3.4 - 10.8 x10E3/uL 6.1  6.1  7.9   Hemoglobin 11.1 - 15.9 g/dL 87.8  88.5  88.4   Hematocrit 34.0 - 46.6 % 40.6  37.2  38.2   Platelets 150 - 450 x10E3/uL 315  272  361       Latest Ref Rng &  Units 04/25/2023    4:25 PM 02/07/2023    9:29 AM 04/09/2022    9:39 AM  CMP  Glucose 70 - 99 mg/dL 93  86  88   BUN 6 - 24 mg/dL 5  4  7    Creatinine 0.57 - 1.00 mg/dL 9.41  9.36  9.45   Sodium 134 - 144 mmol/L 141  139  136   Potassium 3.5 - 5.2 mmol/L 4.9  4.7  3.7   Chloride 96 - 106 mmol/L 102  103  101   CO2 20 - 29 mmol/L 22  28  27    Calcium 8.7 - 10.2 mg/dL 9.8  9.3  9.4   Total Protein 6.0 - 8.5 g/dL 7.7  7.3  7.9   Total Bilirubin 0.0 - 1.2 mg/dL 0.2  0.5  0.4   Alkaline Phos 44 - 121 IU/L 66  59  55   AST 0 - 40 IU/L 16  17  21    ALT 0 - 32 IU/L 10  10  11      Latest Reference Range & Units 07/18/23 14:24  Iron 28 - 170 ug/dL 63  UIBC ug/dL 688  TIBC 749 - 549 ug/dL 625  Saturation Ratios 10.4 - 31.8 % 17  Ferritin 11 - 307 ng/mL 38  Folate >5.9 ng/mL 15.2  Vitamin B12 180 - 914 pg/mL 343    ASSESSMENT & PLAN:  A 43 y.o. female with alpha thalassemia minor (@-/@-) and iron deficiency anemia.  Her hemoglobin and iron levels have not significantly changed over  these past 6 months.  As she does have alpha thalassemia, her hemoglobin will always be slightly low.  Overall, she appears to be doing well.  I will see her back in another 6 months for repeat clinical assessment.  The patient understands all the plans discussed today and is in agreement with them.  Prashant Glosser DELENA Kerns, MD

## 2024-01-23 ENCOUNTER — Inpatient Hospital Stay

## 2024-01-23 ENCOUNTER — Telehealth: Payer: Self-pay | Admitting: Oncology

## 2024-01-23 ENCOUNTER — Other Ambulatory Visit: Payer: Self-pay | Admitting: Oncology

## 2024-01-23 ENCOUNTER — Inpatient Hospital Stay: Admitting: Oncology

## 2024-01-23 ENCOUNTER — Inpatient Hospital Stay (HOSPITAL_BASED_OUTPATIENT_CLINIC_OR_DEPARTMENT_OTHER): Admitting: Oncology

## 2024-01-23 ENCOUNTER — Inpatient Hospital Stay: Attending: Oncology

## 2024-01-23 VITALS — BP 112/91 | HR 71 | Temp 98.2°F | Resp 14 | Ht 62.0 in | Wt 175.3 lb

## 2024-01-23 DIAGNOSIS — D500 Iron deficiency anemia secondary to blood loss (chronic): Secondary | ICD-10-CM

## 2024-01-23 DIAGNOSIS — D560 Alpha thalassemia: Secondary | ICD-10-CM

## 2024-01-23 LAB — FERRITIN: Ferritin: 40 ng/mL (ref 11–307)

## 2024-01-23 LAB — CBC WITH DIFFERENTIAL (CANCER CENTER ONLY)
Abs Immature Granulocytes: 0.01 K/uL (ref 0.00–0.07)
Basophils Absolute: 0.1 K/uL (ref 0.0–0.1)
Basophils Relative: 1 %
Eosinophils Absolute: 0.1 K/uL (ref 0.0–0.5)
Eosinophils Relative: 2 %
HCT: 36.7 % (ref 36.0–46.0)
Hemoglobin: 11.4 g/dL — ABNORMAL LOW (ref 12.0–15.0)
Immature Granulocytes: 0 %
Lymphocytes Relative: 48 %
Lymphs Abs: 2.3 K/uL (ref 0.7–4.0)
MCH: 22.4 pg — ABNORMAL LOW (ref 26.0–34.0)
MCHC: 31.1 g/dL (ref 30.0–36.0)
MCV: 72.1 fL — ABNORMAL LOW (ref 80.0–100.0)
Monocytes Absolute: 0.4 K/uL (ref 0.1–1.0)
Monocytes Relative: 8 %
Neutro Abs: 2 K/uL (ref 1.7–7.7)
Neutrophils Relative %: 41 %
Platelet Count: 312 K/uL (ref 150–400)
RBC: 5.09 MIL/uL (ref 3.87–5.11)
RDW: 14.9 % (ref 11.5–15.5)
WBC Count: 4.8 K/uL (ref 4.0–10.5)
nRBC: 0 % (ref 0.0–0.2)

## 2024-01-23 LAB — IRON AND TIBC
Iron: 123 ug/dL (ref 28–170)
Saturation Ratios: 32 % — ABNORMAL HIGH (ref 10.4–31.8)
TIBC: 388 ug/dL (ref 250–450)
UIBC: 265 ug/dL

## 2024-01-23 NOTE — Telephone Encounter (Signed)
 Patient has been scheduled for follow-up visit per 01/23/24 LOS.  Pt noted appt details on personal electronic device.

## 2024-01-24 ENCOUNTER — Encounter: Payer: Self-pay | Admitting: Oncology

## 2024-02-03 ENCOUNTER — Other Ambulatory Visit (HOSPITAL_BASED_OUTPATIENT_CLINIC_OR_DEPARTMENT_OTHER): Payer: Self-pay

## 2024-02-03 ENCOUNTER — Ambulatory Visit (HOSPITAL_BASED_OUTPATIENT_CLINIC_OR_DEPARTMENT_OTHER): Admitting: Student

## 2024-02-03 VITALS — BP 106/72 | HR 79 | Temp 98.4°F | Resp 16 | Ht 62.0 in | Wt 173.0 lb

## 2024-02-03 DIAGNOSIS — E6609 Other obesity due to excess calories: Secondary | ICD-10-CM | POA: Diagnosis not present

## 2024-02-03 DIAGNOSIS — E66811 Obesity, class 1: Secondary | ICD-10-CM

## 2024-02-03 DIAGNOSIS — I4729 Other ventricular tachycardia: Secondary | ICD-10-CM

## 2024-02-03 DIAGNOSIS — M792 Neuralgia and neuritis, unspecified: Secondary | ICD-10-CM

## 2024-02-03 DIAGNOSIS — Z6831 Body mass index (BMI) 31.0-31.9, adult: Secondary | ICD-10-CM

## 2024-02-03 DIAGNOSIS — G5603 Carpal tunnel syndrome, bilateral upper limbs: Secondary | ICD-10-CM

## 2024-02-03 MED ORDER — GABAPENTIN 100 MG PO CAPS: 100.0000 mg | ORAL_CAPSULE | Freq: Three times a day (TID) | ORAL | 3 refills | Status: AC

## 2024-02-03 NOTE — Progress Notes (Signed)
 Established Patient Office Visit  Subjective   Patient ID: Judy George, female    DOB: June 09, 1980  Age: 43 y.o. MRN: [id number]  Chief Complaint  Patient presents with   Medical Management of Chronic Issues    Follow up. Right knee still having pain. More on lower leg know. Both legs have been sore. Meloxicam  did not really help.    HPI  Discussed the use of AI scribe software for clinical note transcription with the patient, who gave verbal consent to proceed.  History of Present Illness   Judy George is a 43 year old female who presents with persistent knee pain and tingling in her leg.  She experiences persistent knee pain, described as aching and sore, primarily affecting one leg but occasionally involving the other. The pain remains constant throughout the day and worsens when transitioning from sitting to standing. She likens the discomfort to feeling like a 'sixty something year old'. Previous knee X-rays were normal.  She also experiences tingling sensations in her hand and fingers, affecting all fingers on one hand. She uses a brace at night to alleviate these symptoms. A prior nerve conduction study suggested a pinched nerve, possibly in her neck, contributing to these symptoms.  Additionally, she has tingling in her foot, particularly on the same side as the leg pain. The tingling is constant and unaffected by pressure or activity. No back pain reported.  She has a history of receiving shoulder injections for pain, which initially provided relief, but the tingling and pain have persisted for over a year. She has mentioned these symptoms in past medical visits.  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 She is seeking weight loss options, having previously attempted weight loss medication without success due to insurance issues.      Patient Active Problem List   Diagnosis Date Noted    Nonintractable episodic headache 09/27/2023   Vertigo 09/27/2023   Acne vulgaris 09/23/2023   Xerosis cutis 07/08/2023   Perimenopausal symptoms 07/08/2023   PCOS (polycystic ovarian syndrome) 07/08/2023   Calculus of kidney 03/14/2023   Reactive airway disease 03/14/2023   Hx of renal calculi 03/14/2023   Iron deficiency anemia due to chronic blood loss 10/05/2022   History of PCOS 09/17/2022   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   History of colon polyps    Alpha+ thalassemia    Thalassemia minor 07/12/2019   Class 1 obesity due to excess calories without serious comorbidity with body mass index (BMI) of 31.0 to 31.9 in adult 06/04/2019   History of irregular heartbeat 06/05/2016   Ventral hernia 11/30/2015   Past Medical History:  Diagnosis Date   AKI (acute kidney injury) 03/14/2023   Alpha+ thalassemia    Anemia    Asymptomatic bacteriuria 03/14/2023   BV (bacterial vaginosis) 03/14/2023   Chronic constipation 06/05/2016   Diastasis recti 11/30/2015   History of colon polyps    History of irregular heartbeat 06/05/2016   Kidney stones    Ovarian cyst    right   Social History   Tobacco Use   Smoking status: Never    Passive exposure: Never   Smokeless tobacco: Never  Vaping Use   Vaping status: Never Used  Substance Use Topics   Alcohol use: Never   Drug use: Never   No Known Allergies    ROS Per HPI.    Objective:  BP 106/72   Pulse 79   Temp 98.4 F (36.9 C) (Oral)   Resp 16   Ht 5' 2 (1.575 m)   Wt 173 lb (78.5 kg)   LMP 01/22/2024 (Exact Date)   SpO2 99%   BMI 31.64 kg/m  BP Readings from Last 3 Encounters:  02/03/24 106/72  01/23/24 (!) 112/91  12/23/23 104/68   Wt Readings from Last 3 Encounters:  02/03/24 173 lb (78.5 kg)  01/23/24 175 lb 4.8 oz (79.5 kg)  12/23/23 175 lb 12.8 oz (79.7 kg)   SpO2 Readings from Last 3 Encounters:  02/03/24 99%  01/23/24 100%  12/23/23  98%      Physical Exam Constitutional:      General: She is not in acute distress.    Appearance: Normal appearance. She is not ill-appearing.  HENT:     Head: Normocephalic and atraumatic.     Nose: Nose normal.  Eyes:     General: No scleral icterus.    Conjunctiva/sclera: Conjunctivae normal.  Cardiovascular:     Rate and Rhythm: Normal rate and regular rhythm.     Heart sounds: Normal heart sounds. No murmur heard.    No friction rub.  Pulmonary:     Effort: Pulmonary effort is normal. No respiratory distress.     Breath sounds: Normal breath sounds. No wheezing, rhonchi or rales.  Musculoskeletal:        General: Normal range of motion.  Skin:    General: Skin is warm and dry.     Coloration: Skin is not jaundiced or pale.  Neurological:     General: No focal deficit present.     Mental Status: She is alert.  Psychiatric:        Mood and Affect: Mood normal.        Behavior: Behavior normal.      No results found for any visits on 02/03/24.  Last CBC Lab Results  Component Value Date   WBC 4.8 01/23/2024   HGB 11.4 (L) 01/23/2024   HCT 36.7 01/23/2024   MCV 72.1 (L) 01/23/2024   MCH 22.4 (L) 01/23/2024   RDW 14.9 01/23/2024   PLT 312 01/23/2024   Last metabolic panel Lab Results  Component Value Date   GLUCOSE 93 04/25/2023   NA 141 04/25/2023   K 4.9 04/25/2023   CL 102 04/25/2023   CO2 22 04/25/2023   BUN 5 (L) 04/25/2023   CREATININE 0.58 04/25/2023   EGFR 114 04/25/2023   CALCIUM 9.8 04/25/2023   PROT 7.7 04/25/2023   ALBUMIN 4.4 04/25/2023   LABGLOB 3.3 04/25/2023   AGRATIO 1.5 10/23/2019   BILITOT 0.2 04/25/2023   ALKPHOS 66 04/25/2023   AST 16 04/25/2023   ALT 10 04/25/2023   Last lipids Lab Results  Component Value Date   CHOL 178 09/23/2023   HDL 69 09/23/2023   LDLCALC 102 (H) 09/23/2023   TRIG 34 09/23/2023   CHOLHDL 2.6 09/23/2023   Last hemoglobin A1c Lab Results  Component Value Date   HGBA1C 5.6 04/25/2023       The 10-year ASCVD risk score (Arnett DK, et al., 2019) is: 0.3%    Assessment & Plan:   Assessment and Plan    Chronic right lower extremity neuropathic pain/Hx of Knee Pain Chronic neuropathic pain and paresthesia in the right lower extremity, primarily affecting the calf and foot, with tingling and aching sensations. Symptoms are persistent throughout the day and not relieved by rest. No acute changes  in symptoms. No significant swelling or redness. Good strength in major muscle groups of the foot. No immediate concern for stroke or acute vascular issues- chronic vascular issue remains a possibility, though I believe unlikely. - Referred to orthopedics for further evaluation and management. - Prescribed low-dose gabapentin  for symptom relief, with potential side effects including brain fog and lower leg swelling.  Bilateral hand paresthesia and pain, possible carpal tunnel syndrome Bilateral hand paresthesia and pain, with tingling in all fingers. Symptoms suggestive of carpal tunnel syndrome, though atypical as all fingers are affected. Previous nerve conduction study indicated a pinched nerve in the neck or nerve root. - Continue using wrist brace at night for symptom relief.  Obesity, class 1 Obesity, class 1. Previous attempts to obtain weight loss medication were unsuccessful due to insurance issues. Discussed alternative weight loss medications and clinics, but she prefers not to pursue additional options at this time. Hx of NSVT, not a good candidate for phentermine . - Discussed sending contrave  to specialty pharmacy- declined - Discussed alternative weight loss medications and clinics, but she opted not to pursue further options.  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 Current management includes beta blockers, which contraindicate the use of stimulant weight loss medication (phentermine ). - Continue current management with beta blockers. -  Continue to follow with Cardiology      Return if symptoms worsen or fail to improve.    Rony Ratz T Massie Mees, PA-C

## 2024-02-03 NOTE — Patient Instructions (Addendum)
 It was nice to see you today!  - If you do not hear from ortho in a couple weeks please let me know  If you have any problems before your next visit feel free to message me via MyChart (minor issues or questions) or call the office, otherwise you may reach out to schedule an office visit.  Thank you! Jubilee Vivero, PA-C

## 2024-02-07 DIAGNOSIS — I4729 Other ventricular tachycardia: Secondary | ICD-10-CM | POA: Insufficient documentation

## 2024-02-07 DIAGNOSIS — G5603 Carpal tunnel syndrome, bilateral upper limbs: Secondary | ICD-10-CM | POA: Insufficient documentation

## 2024-02-07 DIAGNOSIS — M792 Neuralgia and neuritis, unspecified: Secondary | ICD-10-CM | POA: Insufficient documentation

## 2024-02-10 ENCOUNTER — Encounter (HOSPITAL_BASED_OUTPATIENT_CLINIC_OR_DEPARTMENT_OTHER): Payer: Self-pay

## 2024-02-21 ENCOUNTER — Encounter: Payer: Self-pay | Admitting: Sports Medicine

## 2024-02-21 ENCOUNTER — Ambulatory Visit: Admitting: Sports Medicine

## 2024-02-21 DIAGNOSIS — M1711 Unilateral primary osteoarthritis, right knee: Secondary | ICD-10-CM

## 2024-02-21 DIAGNOSIS — R202 Paresthesia of skin: Secondary | ICD-10-CM

## 2024-02-21 DIAGNOSIS — R200 Anesthesia of skin: Secondary | ICD-10-CM

## 2024-02-21 DIAGNOSIS — G8929 Other chronic pain: Secondary | ICD-10-CM | POA: Diagnosis not present

## 2024-02-21 DIAGNOSIS — M25561 Pain in right knee: Secondary | ICD-10-CM | POA: Diagnosis not present

## 2024-02-21 NOTE — Progress Notes (Signed)
 Patient says that she has had right knee pain for years. She says that her pain is in the back of the knee and sometimes the anterior knee inferior to the patella. She

## 2024-02-21 NOTE — Progress Notes (Signed)
 Judy George - 43 y.o. female MRN [id number]  Date of birth: 04/26/1980  Office Visit Note: Visit Date: 02/21/2024 PCP: Iven Lang DASEN, PA-C Referred by: Iven Lang DASEN, PA-C  Subjective: Chief Complaint  Patient presents with   Right Knee - Pain   HPI: Judy George is a pleasant 43 y.o. female who presents today for acute on chronic right knee pain.  She has had right knee pain for years.  Was previously treated by an orthopedic physician at [facility].  Had x-rays that did not show any acute abnormality.  More of her pain is over the medial joint line.  She does get swelling that waxes and wanes with activity.  She has associated clicking and popping within the knee.  Occasionally she will feel tingling in the calf muscle but denies any imaging or symptoms from the low back.  Her husband previously had a meniscal injury with meniscectomy and she states that a lot of her symptoms overlap what he was going through.  He did have therapy exercises which she has been performing with stretches and other activity although her symptoms still persist.  She did see her primary care physician on 02/03/2024, note reviewed today.  She did have x-rays ordered for my review.  Does report a history of numbness and tingling in the hands and fingers with previous history of CTS.  Prescribed low-dose gabapentin  100 mg 3 times daily.  She had trialed a short course of meloxicam  15 mg in the past.  Currently using over-the-counter Tylenol and Advil as needed.  Pertinent ROS were reviewed with the patient and found to be negative unless otherwise specified above in HPI.   Assessment & Plan: Visit Diagnoses:  1. Chronic pain of right knee   2. Unilateral primary osteoarthritis, right knee   3. Numbness and tingling of right leg    Plan: Impression is acute on chronic right knee pain which has been ongoing despite conservative treatment, HEP, stretches, and oral medication.  She only  has mild arthritic change and more of her symptoms are indicative of possible meniscal degenerative tearing/involvement with clicking/catching as well as intermittent swelling within the knee joint.  This has been ongoing for years but recently worsened.  We will move forward with MRI of the knee to evaluate the meniscus and internal pathology.  I would like to get her started in formalized physical therapy to help strengthen and stabilize the knee as well, but she may continue her home exercises as she has been doing in the interim.  She does have tingling in the leg and calf, although this provocatively does not seem to be emanating from the low back at this time, likely more compensatory given her knee pain and gait change.  She may continue her gabapentin  100 mg twice daily-3 times daily that her PCP recommended.  We discussed oral anti-inflammatory medication, corticosteroid injection for the knee, but will hold for now and let MRI guide her treatment management.  Follow-up: Return for f/u 1-week after R-knee MRI.   Meds & Orders: No orders of the defined types were placed in this encounter.   Orders Placed This Encounter  Procedures   MR Knee Right w/o contrast   Ambulatory referral to Physical Therapy     Procedures: No procedures performed      Clinical History: No specialty comments available.  She reports that she has never smoked. She has never been exposed to tobacco smoke. She has never used smokeless  tobacco.  Recent Labs    04/25/23 1625  HGBA1C 5.6    Objective:   Vital Signs: LMP 01/22/2024 (Exact Date)   Physical Exam  Gen: Well-appearing, in no acute distress; non-toxic CV: Well-perfused. Warm.  Resp: Breathing unlabored on room air; no wheezing. Psych: Fluid speech in conversation; appropriate affect; normal thought process  Ortho Exam - Right knee/leg: There is a small effusion on the right knee with the left knee having no effusion.  Range of motion 0-135  degrees there is pain with end range flexion.  Positive TTP over the medial joint line with palpation.  Positive Thessaly's test over the medial compartment. Equivocal McMurray's testing.  There is patellofemoral crepitus noted with flexion and extension.  - Lumbar: Full range of motion with flexion and extension.  Negative straight leg raise bilaterally.  Negative slump testing.  Imaging:  *2 view x-ray of the right knee from 12/28/2023 was independently reviewed and interpreted by myself today.  X-rays demonstrate minimal to mild medial tibiofemoral joint space narrowing but no significant arthritic change.  No acute fracture noted.  No soft tissue abnormality.  Likely fabella noted in the posterior fossa.  Narrative & Impression  CLINICAL DATA:  Right knee pain.   EXAM: RIGHT KNEE - 1-2 VIEW   COMPARISON:  None Available.   FINDINGS: Normal anatomic alignment. No acute fracture or dislocation. Mild multi compartment degenerative changes. No joint effusion. Regional soft tissues unremarkable.   IMPRESSION: Mild degenerative changes. No acute process.     Electronically Signed   By: Bard Moats M.D.   On: 12/29/2023 20:02     Past Medical/Family/Surgical/Social History: Medications & Allergies reviewed per EMR, new medications updated. Patient Active Problem List   Diagnosis Date Noted   Neuropathic pain 02/07/2024   Bilateral carpal tunnel syndrome 02/07/2024   NSVT (nonsustained ventricular tachycardia) (HCC) 02/07/2024   Nonintractable episodic headache 09/27/2023   Vertigo 09/27/2023   Acne vulgaris 09/23/2023   Xerosis cutis 07/08/2023   Perimenopausal symptoms 07/08/2023   PCOS (polycystic ovarian syndrome) 07/08/2023   Calculus of kidney 03/14/2023   Reactive airway disease 03/14/2023   Hx of renal calculi 03/14/2023   Iron deficiency anemia due to chronic blood loss 10/05/2022   History of PCOS 09/17/2022   Irritable bowel syndrome with constipation  04/09/2022   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   History of colon polyps    Alpha+ thalassemia    Thalassemia minor 07/12/2019   Class 1 obesity due to excess calories without serious comorbidity with body mass index (BMI) of 31.0 to 31.9 in adult 06/04/2019   History of irregular heartbeat 06/05/2016   Ventral hernia 11/30/2015   Past Medical History:  Diagnosis Date   AKI (acute kidney injury) 03/14/2023   Alpha+ thalassemia    Anemia    Asymptomatic bacteriuria 03/14/2023   BV (bacterial vaginosis) 03/14/2023   Chronic constipation 06/05/2016   Diastasis recti 11/30/2015   History of colon polyps    History of irregular heartbeat 06/05/2016   Kidney stones    Ovarian cyst    right   Family History  Problem Relation Age of Onset   Hypertension Mother    Colon polyps Mother    Atrial fibrillation Mother        surgery   Diabetes Maternal Aunt    Heart attack Maternal Aunt    Kidney Stones Maternal Grandmother    Asthma Maternal Grandmother    Hyperlipidemia Maternal Grandmother    Hypertension Maternal Grandmother  Kidney disease Maternal Grandmother    Heart disease Maternal Grandfather    Heart attack Maternal Grandfather    Hypertension Maternal Grandfather    Hyperlipidemia Maternal Grandfather    Colon cancer Neg Hx    Esophageal cancer Neg Hx    Rectal cancer Neg Hx    Stomach cancer Neg Hx    Past Surgical History:  Procedure Laterality Date   CESAREAN SECTION     X3   COLONOSCOPY  2016   Dr CHRISTELLA in [place] 1 removed and it was non cancerous    HERNIA REPAIR  04/2013   TUBAL LIGATION     Social History   Occupational History   Occupation: Dentist  Tobacco Use   Smoking status: Never    Passive exposure: Never   Smokeless tobacco: Never  Vaping Use   Vaping status: Never Used  Substance and Sexual Activity   Alcohol use: Never   Drug use: Never   Sexual activity: Yes    Partners: Male    Birth control/protection: Surgical

## 2024-03-10 ENCOUNTER — Ambulatory Visit (HOSPITAL_BASED_OUTPATIENT_CLINIC_OR_DEPARTMENT_OTHER)
Admission: RE | Admit: 2024-03-10 | Discharge: 2024-03-10 | Disposition: A | Discharge status: Home / Self Care | Source: Ambulatory Visit | Attending: Sports Medicine | Admitting: Sports Medicine

## 2024-03-10 DIAGNOSIS — M25561 Pain in right knee: Secondary | ICD-10-CM

## 2024-03-10 DIAGNOSIS — G8929 Other chronic pain: Secondary | ICD-10-CM | POA: Diagnosis not present

## 2024-03-10 DIAGNOSIS — M1711 Unilateral primary osteoarthritis, right knee: Secondary | ICD-10-CM

## 2024-03-18 ENCOUNTER — Encounter: Payer: Self-pay | Admitting: Sports Medicine

## 2024-03-18 ENCOUNTER — Ambulatory Visit: Admitting: Sports Medicine

## 2024-03-18 DIAGNOSIS — M25561 Pain in right knee: Secondary | ICD-10-CM

## 2024-03-18 DIAGNOSIS — M1711 Unilateral primary osteoarthritis, right knee: Secondary | ICD-10-CM | POA: Diagnosis not present

## 2024-03-18 DIAGNOSIS — G8929 Other chronic pain: Secondary | ICD-10-CM

## 2024-03-18 DIAGNOSIS — E66811 Obesity, class 1: Secondary | ICD-10-CM | POA: Diagnosis not present

## 2024-03-18 DIAGNOSIS — E6609 Other obesity due to excess calories: Secondary | ICD-10-CM | POA: Diagnosis not present

## 2024-03-18 NOTE — Progress Notes (Signed)
 Patient is here for MRI review today. She says that her pain continues to come and go. She has physical therapy scheduled in January, and has not yet started. She does take Tylenol only as needed.

## 2024-03-18 NOTE — Progress Notes (Signed)
 "  Judy George - 43 y.o. female MRN [id number]  Date of birth: [birth year]  Office Visit Note: Visit Date: 03/18/2024 PCP: Iven Lang DASEN, PA-C Referred by: Iven Lang DASEN, PA-C  Subjective: Chief Complaint  Patient presents with   Right Knee - Follow-up   HPI: Judy George is a pleasant 43 y.o. female who presents today for follow-up of chronic right knee pain, also here for MRI review.  Right knee continues to be painful and bothersome to her. Judy George states her pain continues to come and go, but is worse with going up and down steps, after prolonged periods of sitting. She has physical therapy scheduled in January, and has not yet started. She does take Tylenol only as needed.  She has used meloxicam  15 mg in the past but did not notice a big difference.  She was on gabapentin  for her numbness and tingling, this is still present at times but does note that she feels she is walking differently because of the discomfort.  Pertinent ROS were reviewed with the patient and found to be negative unless otherwise specified above in HPI.   Assessment & Plan: Visit Diagnoses:  1. Patellofemoral arthritis of right knee   2. Unilateral primary osteoarthritis, right knee   3. Chronic pain of right knee   4. Class 1 obesity due to excess calories without serious comorbidity in adult, unspecified BMI    Plan: Impression is chronic right knee pain with symptomatology and radiographic findings of advanced cartilage loss of the medial patellofemoral joint.  There is a mild degree of medial tibiofemoral joint line OA as well.  This continues to be bothersome for her despite over-the-counter medication, she is engaging in formalized physical therapy but does not start until this upcoming month.  This is present in the setting of class I obesity.  We discussed treatment options for [name].  Given that meloxicam  was not helpful, she may discontinue this.  Okay to use Tylenol only as needed.   She does have some occasional numbness and tingling down the lateral But has full strength and does not have signs of symptom emanating from the low back today, she was on gabapentin  100mg  TID but will have her discontinue at this time - will see how responds/improves with PT. we did discuss next steps the knee which could consider a corticosteroid injection, as well as viscosupplementation/gel injections to help lubricate the knee joint.  I would like PT to work on specific exercises as well as show her bracing/KT taping for the patellofemoral joint.   This would be a last resort, but could consider isolated patellofemoral joint placement.  Let me know how she is doing after about 6 weeks of starting PT.  Follow-up: Return for May mychart/call update in about 6 after starting PT.   Meds & Orders: No orders of the defined types were placed in this encounter.  No orders of the defined types were placed in this encounter.    Procedures: No procedures performed      Clinical History: No specialty comments available.  She reports that she has never smoked. She has never been exposed to tobacco smoke. She has never used smokeless tobacco.  Recent Labs    04/25/23 1625  HGBA1C 5.6    Objective:   Vital Signs: There were no vitals taken for this visit.  Physical Exam  Gen: Well-appearing, in no acute distress; non-toxic CV: Well-perfused. Warm.  Resp: Breathing unlabored on room air; no wheezing. Psych:  Fluid speech in conversation; appropriate affect; normal thought process  Ortho Exam - Right knee: + Notable patellofemoral crepitus.  There is positive TTP over the medial patellofemoral joint.  Range of motion preserved from 0-135 degrees but pain with endrange flexion.  Positive theater sign. Trace effusion present today but no significant joint swelling.  Imaging:  *I did independently review the right knee MRI myself and with the patient in the room today.  MR Knee Right w/o  contrast CLINICAL DATA:  Chronic right knee pain.  No known injury.  EXAM: MRI OF THE RIGHT KNEE WITHOUT CONTRAST  TECHNIQUE: Multiplanar, multisequence MR imaging of the knee was performed. No intravenous contrast was administered.  COMPARISON:  Plain films right knee 12/28/2023.  FINDINGS: MENISCI  Medial meniscus:  Intact.  Lateral meniscus:  Intact.  LIGAMENTS  Cruciates:  Intact.  Collaterals:  Intact.  CARTILAGE  Patellofemoral: Cartilage along the medial patellar facet is denuded. There is underlying subchondral marrow edema.  Medial:  Markedly thinned without focal defect.  Lateral:  Minimally degenerated.  Joint:  Small joint effusion.  Popliteal Fossa:  No Baker's cyst.  Extensor Mechanism:  Intact.  Bones: No fracture, stress change or focal lesion. Osteophytosis about the knee noted.  Other: None.  IMPRESSION: 1. Osteoarthritis about the knee is most severe in the medial patellofemoral compartment. 2. Negative for meniscal or ligament tear.  Electronically Signed   By: Debby Prader M.D.   On: 03/18/2024 09:40   Past Medical/Family/Surgical/Social History: Medications & Allergies reviewed per EMR, new medications updated. Patient Active Problem List   Diagnosis Date Noted   Neuropathic pain 02/07/2024   Bilateral carpal tunnel syndrome 02/07/2024   NSVT (nonsustained ventricular tachycardia) (HCC) 02/07/2024   Nonintractable episodic headache 09/27/2023   Vertigo 09/27/2023   Acne vulgaris 09/23/2023   Xerosis cutis 07/08/2023   Perimenopausal symptoms 07/08/2023   PCOS (polycystic ovarian syndrome) 07/08/2023   Calculus of kidney 03/14/2023   Reactive airway disease 03/14/2023   Hx of renal calculi 03/14/2023   Iron deficiency anemia due to chronic blood loss 10/05/2022   History of PCOS 09/17/2022   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   History of colon polyps    Alpha+  thalassemia    Thalassemia minor 07/12/2019   Class 1 obesity due to excess calories without serious comorbidity with body mass index (BMI) of 31.0 to 31.9 in adult 06/04/2019   History of irregular heartbeat 06/05/2016   Ventral hernia 11/30/2015   Past Medical History:  Diagnosis Date   AKI (acute kidney injury) 03/14/2023   Alpha+ thalassemia    Anemia    Asymptomatic bacteriuria 03/14/2023   BV (bacterial vaginosis) 03/14/2023   Chronic constipation 06/05/2016   Diastasis recti 11/30/2015   History of colon polyps    History of irregular heartbeat 06/05/2016   Kidney stones    Ovarian cyst    right   Family History  Problem Relation Age of Onset   Hypertension Mother    Colon polyps Mother    Atrial fibrillation Mother        surgery   Diabetes Maternal Aunt    Heart attack Maternal Aunt    Kidney Stones Maternal Grandmother    Asthma Maternal Grandmother    Hyperlipidemia Maternal Grandmother    Hypertension Maternal Grandmother    Kidney disease Maternal Grandmother    Heart disease Maternal Grandfather    Heart attack Maternal Grandfather    Hypertension Maternal Grandfather  Hyperlipidemia Maternal Grandfather    Colon cancer Neg Hx    Esophageal cancer Neg Hx    Rectal cancer Neg Hx    Stomach cancer Neg Hx    Past Surgical History:  Procedure Laterality Date   CESAREAN SECTION     X3   COLONOSCOPY  2016   Dr CHRISTELLA in [place] 1 removed and it was non cancerous    HERNIA REPAIR  04/2013   TUBAL LIGATION     Social History   Occupational History   Occupation: Dentist  Tobacco Use   Smoking status: Never    Passive exposure: Never   Smokeless tobacco: Never  Vaping Use   Vaping status: Never Used  Substance and Sexual Activity   Alcohol use: Never   Drug use: Never   Sexual activity: Yes    Partners: Male    Birth control/protection: Surgical   "

## 2024-04-06 ENCOUNTER — Encounter (HOSPITAL_BASED_OUTPATIENT_CLINIC_OR_DEPARTMENT_OTHER): Payer: Self-pay | Admitting: Student

## 2024-04-06 ENCOUNTER — Ambulatory Visit (INDEPENDENT_AMBULATORY_CARE_PROVIDER_SITE_OTHER): Admitting: Student

## 2024-04-06 VITALS — BP 102/69 | HR 69 | Temp 98.2°F | Resp 16 | Ht 62.0 in | Wt 177.0 lb

## 2024-04-06 DIAGNOSIS — Z13220 Encounter for screening for lipoid disorders: Secondary | ICD-10-CM

## 2024-04-06 DIAGNOSIS — R202 Paresthesia of skin: Secondary | ICD-10-CM

## 2024-04-06 DIAGNOSIS — Z136 Encounter for screening for cardiovascular disorders: Secondary | ICD-10-CM | POA: Diagnosis not present

## 2024-04-06 DIAGNOSIS — Z1231 Encounter for screening mammogram for malignant neoplasm of breast: Secondary | ICD-10-CM

## 2024-04-06 DIAGNOSIS — Z0000 Encounter for general adult medical examination without abnormal findings: Secondary | ICD-10-CM | POA: Diagnosis not present

## 2024-04-06 DIAGNOSIS — Z6832 Body mass index (BMI) 32.0-32.9, adult: Secondary | ICD-10-CM

## 2024-04-06 DIAGNOSIS — E6609 Other obesity due to excess calories: Secondary | ICD-10-CM

## 2024-04-06 DIAGNOSIS — E66811 Obesity, class 1: Secondary | ICD-10-CM | POA: Diagnosis not present

## 2024-04-06 LAB — COMPREHENSIVE METABOLIC PANEL WITH GFR
ALT: 7 IU/L (ref 0–32)
AST: 17 IU/L (ref 0–40)
Albumin: 4.6 g/dL (ref 3.9–4.9)
Alkaline Phosphatase: 76 IU/L (ref 41–116)
BUN/Creatinine Ratio: 11 (ref 9–23)
BUN: 7 mg/dL (ref 6–24)
Bilirubin Total: 0.3 mg/dL (ref 0.0–1.2)
CO2: 22 mmol/L (ref 20–29)
Calcium: 9.5 mg/dL (ref 8.7–10.2)
Chloride: 102 mmol/L (ref 96–106)
Creatinine, Ser: 0.63 mg/dL (ref 0.57–1.00)
Globulin, Total: 3 g/dL (ref 1.5–4.5)
Glucose: 94 mg/dL (ref 70–99)
Potassium: 4.4 mmol/L (ref 3.5–5.2)
Sodium: 140 mmol/L (ref 134–144)
Total Protein: 7.6 g/dL (ref 6.0–8.5)
eGFR: 111 mL/min/1.73

## 2024-04-06 LAB — CBC WITH DIFFERENTIAL/PLATELET
Basophils Absolute: 0 x10E3/uL (ref 0.0–0.2)
Basos: 1 %
EOS (ABSOLUTE): 0.1 x10E3/uL (ref 0.0–0.4)
Eos: 1 %
Hematocrit: 40.1 % (ref 34.0–46.6)
Hemoglobin: 12.1 g/dL (ref 11.1–15.9)
Immature Grans (Abs): 0 x10E3/uL (ref 0.0–0.1)
Immature Granulocytes: 0 %
Lymphocytes Absolute: 2.1 x10E3/uL (ref 0.7–3.1)
Lymphs: 46 %
MCH: 22.7 pg — ABNORMAL LOW (ref 26.6–33.0)
MCHC: 30.2 g/dL — ABNORMAL LOW (ref 31.5–35.7)
MCV: 75 fL — ABNORMAL LOW (ref 79–97)
Monocytes Absolute: 0.3 x10E3/uL (ref 0.1–0.9)
Monocytes: 6 %
Neutrophils Absolute: 2.1 x10E3/uL (ref 1.4–7.0)
Neutrophils: 46 %
Platelets: 287 x10E3/uL (ref 150–450)
RBC: 5.33 x10E6/uL — ABNORMAL HIGH (ref 3.77–5.28)
RDW: 14.1 % (ref 11.7–15.4)
WBC: 4.5 x10E3/uL (ref 3.4–10.8)

## 2024-04-06 LAB — LIPID PANEL
Chol/HDL Ratio: 2.8 ratio (ref 0.0–4.4)
Cholesterol, Total: 186 mg/dL (ref 100–199)
HDL: 67 mg/dL
LDL Chol Calc (NIH): 110 mg/dL — ABNORMAL HIGH (ref 0–99)
Triglycerides: 44 mg/dL (ref 0–149)
VLDL Cholesterol Cal: 9 mg/dL (ref 5–40)

## 2024-04-06 MED ORDER — CONTRAVE 8-90 MG PO TB12: ORAL_TABLET | ORAL | 1 refills | Status: AC
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
## 2024-04-06 NOTE — Patient Instructions (Signed)
 It was nice to see you today!  Things to do to keep yourself healthy! - Exercise at least 30-45 minutes a day, 3-4 days a week.  - Eat a low-fat diet with lots of fruits and vegetables, up to 7-9 servings per day.  - Seatbelts can save your life. Wear them always.  - Smoke detectors on every level of your home, check batteries every year.  - Eye Doctor: have an eye exam every 1-2 years, even if you do not wear glasses or contacts. - Safe sex: if you may be exposed to STDs, use a condom.  - Alcohol: If you drink, do it moderately, less than 2 drinks per day.  - Health Care Power of Attorney: choose someone to speak for you if you are not able.  - Depression and anxiety are common in our stressful world.If you're feeling stressed, down, or like you're losing interest in things you normally enjoy, please come in for a visit.  - Violence: If anyone is threatening or hurting you, please call immediately.  Everyone deserves to be safe and loved in all of the relationships.   High protein snacks are about 1 gram of protein to about 10 calories.  If you have any problems before your next visit feel free to message me via MyChart (minor issues or questions) or call the office, otherwise you may reach out to schedule an office visit.  Thank you! Karrissa Parchment, PA-C

## 2024-04-07 ENCOUNTER — Ambulatory Visit (HOSPITAL_BASED_OUTPATIENT_CLINIC_OR_DEPARTMENT_OTHER): Payer: Self-pay | Admitting: Student

## 2024-04-10 ENCOUNTER — Encounter (HOSPITAL_BASED_OUTPATIENT_CLINIC_OR_DEPARTMENT_OTHER): Payer: Self-pay

## 2024-04-13 ENCOUNTER — Inpatient Hospital Stay (HOSPITAL_BASED_OUTPATIENT_CLINIC_OR_DEPARTMENT_OTHER): Admission: RE | Admit: 2024-04-13 | Source: Ambulatory Visit | Admitting: Radiology

## 2024-04-15 ENCOUNTER — Ambulatory Visit: Admitting: Urology

## 2024-04-22 ENCOUNTER — Inpatient Hospital Stay (HOSPITAL_BASED_OUTPATIENT_CLINIC_OR_DEPARTMENT_OTHER)
Admission: RE | Admit: 2024-04-22 | Discharge: 2024-04-22 | Disposition: A | Source: Ambulatory Visit | Attending: Student | Admitting: Radiology

## 2024-04-22 DIAGNOSIS — Z1231 Encounter for screening mammogram for malignant neoplasm of breast: Secondary | ICD-10-CM

## 2024-05-01 ENCOUNTER — Ambulatory Visit: Admitting: Urology

## 2024-07-15 ENCOUNTER — Inpatient Hospital Stay

## 2024-07-15 ENCOUNTER — Inpatient Hospital Stay: Admitting: Oncology

## 2024-10-13 ENCOUNTER — Ambulatory Visit (HOSPITAL_BASED_OUTPATIENT_CLINIC_OR_DEPARTMENT_OTHER): Admitting: Student
